# Patient Record
Sex: Male | Born: 1980 | Race: White | Hispanic: No | Marital: Married | Smoking: Never smoker
Health system: Southern US, Community
[De-identification: ages and names within clinical notes are randomized; demographics above are authoritative.]

## PROBLEM LIST (undated history)

## (undated) DIAGNOSIS — N200 Calculus of kidney: Secondary | ICD-10-CM

## (undated) DIAGNOSIS — E669 Obesity, unspecified: Secondary | ICD-10-CM

## (undated) DIAGNOSIS — Z87442 Personal history of urinary calculi: Secondary | ICD-10-CM

## (undated) DIAGNOSIS — I499 Cardiac arrhythmia, unspecified: Secondary | ICD-10-CM

## (undated) DIAGNOSIS — F419 Anxiety disorder, unspecified: Secondary | ICD-10-CM

## (undated) DIAGNOSIS — G4730 Sleep apnea, unspecified: Secondary | ICD-10-CM

## (undated) DIAGNOSIS — J309 Allergic rhinitis, unspecified: Secondary | ICD-10-CM

## (undated) DIAGNOSIS — E119 Type 2 diabetes mellitus without complications: Secondary | ICD-10-CM

## (undated) HISTORY — DX: Allergic rhinitis, unspecified: J30.9

## (undated) HISTORY — DX: Cardiac arrhythmia, unspecified: I49.9

## (undated) HISTORY — PX: VASCULAR SURGERY: SHX849

## (undated) HISTORY — PX: CARDIAC ELECTROPHYSIOLOGY STUDY AND ABLATION: SHX1294

## (undated) HISTORY — DX: Anxiety disorder, unspecified: F41.9

## (undated) HISTORY — DX: Obesity, unspecified: E66.9

---
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
## 2000-11-23 ENCOUNTER — Ambulatory Visit (HOSPITAL_COMMUNITY): Admission: RE | Admit: 2000-11-23 | Discharge: 2000-11-23 | Payer: Self-pay | Admitting: Internal Medicine

## 2000-11-23 ENCOUNTER — Encounter: Payer: Self-pay | Admitting: Internal Medicine

## 2001-08-06 ENCOUNTER — Emergency Department (HOSPITAL_COMMUNITY): Admission: EM | Admit: 2001-08-06 | Discharge: 2001-08-06 | Payer: Self-pay | Admitting: Emergency Medicine

## 2002-11-10 ENCOUNTER — Encounter: Payer: Self-pay | Admitting: Family Medicine

## 2002-11-10 ENCOUNTER — Ambulatory Visit (HOSPITAL_COMMUNITY): Admission: RE | Admit: 2002-11-10 | Discharge: 2002-11-10 | Payer: Self-pay | Admitting: Family Medicine

## 2003-07-04 ENCOUNTER — Emergency Department (HOSPITAL_COMMUNITY): Admission: EM | Admit: 2003-07-04 | Discharge: 2003-07-04 | Payer: Self-pay | Admitting: Emergency Medicine

## 2010-10-24 ENCOUNTER — Ambulatory Visit (INDEPENDENT_AMBULATORY_CARE_PROVIDER_SITE_OTHER): Payer: Self-pay | Admitting: Internal Medicine

## 2011-05-30 ENCOUNTER — Other Ambulatory Visit: Payer: Self-pay | Admitting: Family Medicine

## 2011-05-30 ENCOUNTER — Ambulatory Visit (HOSPITAL_COMMUNITY)
Admission: RE | Admit: 2011-05-30 | Discharge: 2011-05-30 | Disposition: A | Discharge status: Home / Self Care | Payer: Managed Care, Other (non HMO) | Source: Ambulatory Visit | Attending: Family Medicine | Admitting: Family Medicine

## 2011-05-30 DIAGNOSIS — R059 Cough, unspecified: Secondary | ICD-10-CM | POA: Insufficient documentation

## 2011-05-30 DIAGNOSIS — R05 Cough: Secondary | ICD-10-CM | POA: Insufficient documentation

## 2011-05-30 DIAGNOSIS — R0609 Other forms of dyspnea: Secondary | ICD-10-CM | POA: Insufficient documentation

## 2011-05-30 DIAGNOSIS — R062 Wheezing: Secondary | ICD-10-CM | POA: Insufficient documentation

## 2011-05-30 DIAGNOSIS — R0989 Other specified symptoms and signs involving the circulatory and respiratory systems: Secondary | ICD-10-CM | POA: Insufficient documentation

## 2011-05-30 MED ORDER — ALBUTEROL SULFATE (5 MG/ML) 0.5% IN NEBU
2.5000 mg | INHALATION_SOLUTION | Freq: Once | RESPIRATORY_TRACT | Status: AC
  Administered 2011-05-30: 2.5 mg via RESPIRATORY_TRACT

## 2011-05-31 NOTE — Procedures (Signed)
NAMETEQUAN, REDMON             ACCOUNT NO.:  [id number]  MEDICAL RECORD NO.:  [id number]  LOCATION:  RESP                          FACILITY:  [facility]  PHYSICIAN:  Paulla Mcclaskey L. Juanetta Gosling, M.D.DATE OF BIRTH:  1981/06/16  DATE OF PROCEDURE: DATE OF DISCHARGE:  05/30/2011                           PULMONARY FUNCTION TEST   REASON FOR PULMONARY FUNCTION TESTING:  Chronic cough.  1. Spirometry does not show a ventilatory defect, but does show     evidence of airflow obstruction.  This could be the cause of her     chronic cough. 2. There is no significant bronchodilator improvement.     Asia Favata L. Juanetta Gosling, M.D.     [facility]/MEDQ  D:  05/31/2011  T:  05/31/2011  Job:  [id number]  cc:   Donna Bernard, M.D. Fax: [phone]

## 2011-08-29 ENCOUNTER — Encounter (INDEPENDENT_AMBULATORY_CARE_PROVIDER_SITE_OTHER): Payer: Self-pay

## 2011-08-30 ENCOUNTER — Other Ambulatory Visit: Payer: Self-pay | Admitting: Family Medicine

## 2011-08-30 DIAGNOSIS — R748 Abnormal levels of other serum enzymes: Secondary | ICD-10-CM

## 2011-09-05 ENCOUNTER — Ambulatory Visit (HOSPITAL_COMMUNITY)
Admission: RE | Admit: 2011-09-05 | Discharge: 2011-09-05 | Disposition: A | Discharge status: Home / Self Care | Payer: Managed Care, Other (non HMO) | Source: Ambulatory Visit | Attending: Family Medicine | Admitting: Family Medicine

## 2011-09-05 ENCOUNTER — Ambulatory Visit (INDEPENDENT_AMBULATORY_CARE_PROVIDER_SITE_OTHER): Payer: Self-pay | Admitting: General Surgery

## 2011-09-05 ENCOUNTER — Other Ambulatory Visit: Payer: Self-pay | Admitting: Family Medicine

## 2011-09-05 DIAGNOSIS — R748 Abnormal levels of other serum enzymes: Secondary | ICD-10-CM

## 2011-09-05 DIAGNOSIS — R160 Hepatomegaly, not elsewhere classified: Secondary | ICD-10-CM | POA: Insufficient documentation

## 2011-09-05 DIAGNOSIS — K7689 Other specified diseases of liver: Secondary | ICD-10-CM | POA: Insufficient documentation

## 2011-09-14 ENCOUNTER — Encounter (INDEPENDENT_AMBULATORY_CARE_PROVIDER_SITE_OTHER): Payer: Self-pay | Admitting: General Surgery

## 2011-09-15 ENCOUNTER — Ambulatory Visit (INDEPENDENT_AMBULATORY_CARE_PROVIDER_SITE_OTHER): Payer: Managed Care, Other (non HMO) | Admitting: Surgery

## 2011-10-25 ENCOUNTER — Ambulatory Visit (INDEPENDENT_AMBULATORY_CARE_PROVIDER_SITE_OTHER): Payer: Managed Care, Other (non HMO) | Admitting: Surgery

## 2012-04-25 ENCOUNTER — Ambulatory Visit (INDEPENDENT_AMBULATORY_CARE_PROVIDER_SITE_OTHER): Payer: Managed Care, Other (non HMO) | Admitting: Otolaryngology

## 2012-05-02 ENCOUNTER — Ambulatory Visit (INDEPENDENT_AMBULATORY_CARE_PROVIDER_SITE_OTHER): Payer: Managed Care, Other (non HMO) | Admitting: Otolaryngology

## 2012-05-02 DIAGNOSIS — K219 Gastro-esophageal reflux disease without esophagitis: Secondary | ICD-10-CM

## 2012-05-02 DIAGNOSIS — J342 Deviated nasal septum: Secondary | ICD-10-CM

## 2012-05-02 DIAGNOSIS — J310 Chronic rhinitis: Secondary | ICD-10-CM

## 2012-05-02 DIAGNOSIS — J351 Hypertrophy of tonsils: Secondary | ICD-10-CM

## 2012-05-02 DIAGNOSIS — R070 Pain in throat: Secondary | ICD-10-CM

## 2012-11-22 ENCOUNTER — Encounter: Payer: Self-pay | Admitting: Family Medicine

## 2012-11-22 ENCOUNTER — Ambulatory Visit (INDEPENDENT_AMBULATORY_CARE_PROVIDER_SITE_OTHER): Payer: Managed Care, Other (non HMO) | Admitting: Family Medicine

## 2012-11-22 VITALS — BP 130/106 | Temp 98.5°F | Ht 75.5 in | Wt 330.0 lb

## 2012-11-22 DIAGNOSIS — G4700 Insomnia, unspecified: Secondary | ICD-10-CM

## 2012-11-22 DIAGNOSIS — R350 Frequency of micturition: Secondary | ICD-10-CM

## 2012-11-22 DIAGNOSIS — N200 Calculus of kidney: Secondary | ICD-10-CM

## 2012-11-22 DIAGNOSIS — J45909 Unspecified asthma, uncomplicated: Secondary | ICD-10-CM | POA: Insufficient documentation

## 2012-11-22 LAB — POCT URINALYSIS DIPSTICK: pH, UA: 6

## 2012-11-22 NOTE — Progress Notes (Signed)
  Subjective:    Patient ID: Aaron Chambers, male    DOB: 09-Jul-1981, 32 y.o.   MRN: [id number]  Urinary Tract Infection  This is a new problem. The current episode started in the past 7 days. The problem occurs every urination. The problem has been gradually worsening. The quality of the pain is described as stabbing. The pain is at a severity of 6/10. The pain is moderate. There has been no fever. The fever has been present for less than 1 day. He is sexually active. There is no history of pyelonephritis. Associated symptoms include flank pain (right flank) and nausea. Pertinent negatives include no chills. He has tried nothing for the symptoms. His past medical history is significant for kidney stones.   Patient notes ongoing chronic anxiety. States the Klonopin helps him. Also helps him when he tries to get sleep at night.  Patient reports wheezing has improved considerably. He is walking and exercising some. No headache or chest pain decent appetite.   Review of Systems  Constitutional: Negative for chills.  Gastrointestinal: Positive for nausea.  Genitourinary: Positive for flank pain (right flank).   ROS otherwise negative.    Objective:   Physical Exam  Alert no acute distress. Obesity present. Vitals reviewed. Blood pressure improved on repeat 132/84. Lungs clear. Heart regular rate and rhythm. Plus minus right flank tenderness to percussion. Some right lower quadrant tenderness evident. Abdomen no rebound no guarding.     urinalysis 6-8 red blood cells per high-power field. No white blood cells no epis. Assessment & Plan:  Impression 1 probable kidney stone discussed at length. #2 chronic anxiety with insomnia stable. Ongoing need for meds. Discussed. #3 asthma clinically stable at this time. Plan hydrocodone 5/325 #24 one every 6 when necessary. Zofran ODT when necessary. Rationale discussed. Warning signs discussed. Also use ibuprofen 600 6 hours when necessary. Encourage  liquids. 25 minutes spent most in discussion. WSLQ.

## 2012-11-22 NOTE — Patient Instructions (Addendum)
Please increase fluid intake next several days. Also take three to four otc ibuprofens three times per day next several days

## 2012-12-18 ENCOUNTER — Encounter: Payer: Self-pay | Admitting: *Deleted

## 2012-12-23 ENCOUNTER — Encounter: Payer: Self-pay | Admitting: Family Medicine

## 2012-12-23 ENCOUNTER — Ambulatory Visit (INDEPENDENT_AMBULATORY_CARE_PROVIDER_SITE_OTHER): Payer: Managed Care, Other (non HMO) | Admitting: Family Medicine

## 2012-12-23 VITALS — BP 138/80 | Wt 349.2 lb

## 2012-12-23 DIAGNOSIS — Z79899 Other long term (current) drug therapy: Secondary | ICD-10-CM

## 2012-12-23 DIAGNOSIS — E782 Mixed hyperlipidemia: Secondary | ICD-10-CM

## 2012-12-23 DIAGNOSIS — Z0000 Encounter for general adult medical examination without abnormal findings: Secondary | ICD-10-CM

## 2012-12-23 MED ORDER — CHLORZOXAZONE 500 MG PO TABS: 500.0000 mg | ORAL_TABLET | Freq: Three times a day (TID) | ORAL | Status: DC | PRN

## 2012-12-23 MED ORDER — ETODOLAC 400 MG PO TABS: 400.0000 mg | ORAL_TABLET | Freq: Two times a day (BID) | ORAL | Status: DC

## 2012-12-23 NOTE — Progress Notes (Signed)
  Subjective:    Patient ID: Aaron Chambers, male    DOB: 1980-09-03, 32 y.o.   MRN: [id number]  Abdominal Pain This is a new problem. The current episode started 1 to 4 weeks ago. The onset quality is gradual. The problem occurs 2 to 4 times per day. The problem has been waxing and waning. The pain is located in the epigastric region. The pain is at a severity of 6/10. The pain is moderate. The quality of the pain is dull and aching. The abdominal pain radiates to the back. Associated symptoms include belching. Pertinent negatives include no anorexia, fever, frequency, headaches or vomiting. Nothing aggravates the pain. The pain is relieved by certain positions. He has tried antacids for the symptoms. The treatment provided no relief. There is no history of abdominal surgery. recent challenge with kidney stones   Patient watching his diet fairly close. Trying to get some exercising.  Reports overall his wheeziness stable.  No further symptoms of kidney stones thankfully.   Review of Systems  Constitutional: Negative for fever, activity change and appetite change.  HENT: Negative for congestion, rhinorrhea and neck pain.   Eyes: Negative for discharge.  Respiratory: Negative for cough and wheezing.   Cardiovascular: Negative for chest pain.  Gastrointestinal: Positive for abdominal pain. Negative for vomiting, blood in stool and anorexia.  Genitourinary: Negative for frequency and difficulty urinating.  Skin: Negative for rash.  Allergic/Immunologic: Negative for environmental allergies and food allergies.  Neurological: Negative for weakness and headaches.  Psychiatric/Behavioral: Negative for agitation.       Objective:   Physical Exam  Vitals reviewed. Constitutional: He appears well-developed and well-nourished.  Morbid obesity noted on exam with BMI of 44  HENT:  Head: Normocephalic and atraumatic.  Right Ear: External ear normal.  Left Ear: External ear normal.  Nose:  Nose normal.  Mouth/Throat: Oropharynx is clear and moist.  Eyes: EOM are normal. Pupils are equal, round, and reactive to light.  Neck: Normal range of motion. Neck supple. No thyromegaly present.  Cardiovascular: Normal rate, regular rhythm and normal heart sounds.   No murmur heard. Pulmonary/Chest: Effort normal and breath sounds normal. No respiratory distress. He has no wheezes.  Abdominal: Soft. Bowel sounds are normal. He exhibits no distension and no mass. There is no tenderness.  Genitourinary: Penis normal.  Musculoskeletal: Normal range of motion. He exhibits no edema.  Right posterior mid back tender to palpation. No obvious deformity  Lymphadenopathy:    He has no cervical adenopathy.  Neurological: He is alert. He exhibits normal muscle tone.  Skin: Skin is warm and dry. No erythema.  Psychiatric: He has a normal mood and affect. His behavior is normal. Judgment normal.          Assessment & Plan:  Impression 1 morbid obesity. #2 subacute thoracic strain discussed. Highly doubt gallstones or kidney stones etc. plan diet exercise discussed and strongly encourage. Appropriate blood work. Lodine twice a day when necessary for back pain. Chlorzoxazone 500 3 times a day when necessary for spasm. WSL

## 2012-12-26 LAB — LIPID PANEL
Total CHOL/HDL Ratio: 4.2 Ratio
VLDL: 23 mg/dL (ref 0–40)

## 2012-12-26 LAB — HEPATIC FUNCTION PANEL
AST: 29 U/L (ref 0–37)
Bilirubin, Direct: 0.1 mg/dL (ref 0.0–0.3)
Total Bilirubin: 0.7 mg/dL (ref 0.3–1.2)

## 2012-12-26 LAB — GLUCOSE, RANDOM: Glucose, Bld: 85 mg/dL (ref 70–99)

## 2013-01-06 ENCOUNTER — Encounter: Payer: Self-pay | Admitting: Family Medicine

## 2013-01-08 ENCOUNTER — Telehealth: Payer: Self-pay | Admitting: Family Medicine

## 2013-01-08 NOTE — Telephone Encounter (Signed)
Enc date 01/06/13 - letter printed & mailed 01/09/13

## 2013-05-09 ENCOUNTER — Other Ambulatory Visit: Payer: Self-pay | Admitting: Nurse Practitioner

## 2013-05-09 MED ORDER — CLONAZEPAM 0.5 MG PO TABS: 0.5000 mg | ORAL_TABLET | Freq: Two times a day (BID) | ORAL | Status: DC | PRN

## 2013-06-09 ENCOUNTER — Encounter: Payer: Self-pay | Admitting: Family Medicine

## 2013-06-09 ENCOUNTER — Ambulatory Visit (INDEPENDENT_AMBULATORY_CARE_PROVIDER_SITE_OTHER): Payer: Managed Care, Other (non HMO) | Admitting: Family Medicine

## 2013-06-09 VITALS — BP 138/98 | Temp 98.8°F | Ht 76.0 in | Wt 326.0 lb

## 2013-06-09 DIAGNOSIS — J329 Chronic sinusitis, unspecified: Secondary | ICD-10-CM

## 2013-06-09 MED ORDER — AMOXICILLIN-POT CLAVULANATE 875-125 MG PO TABS: 1.0000 | ORAL_TABLET | Freq: Two times a day (BID) | ORAL | Status: AC

## 2013-06-09 NOTE — Progress Notes (Signed)
   Subjective:    Patient ID: Aaron Chambers, male    DOB: 09/07/1980, 32 y.o.   MRN: [id number]  Sinusitis This is a new problem. The current episode started in the past 7 days. There has been no fever. Associated symptoms include congestion, coughing, headaches and sinus pressure. Past treatments include oral decongestants. The treatment provided mild relief.   Stirred up the wheezing some,  Pos nasl congestion  Gunkiness, energy level diminished,  Sinus tabs prn. Helped a bit.   Review of Systems  HENT: Positive for congestion and sinus pressure.   Respiratory: Positive for cough.   Neurological: Positive for headaches.   no vomiting no diarrhea no rash ROS otherwise negative     Objective:   Physical Exam Alert, mild malaise blood pressure repeat 134/82. Moderate nasal congestion. Frontal and maxillary tenderness. TMs good pharynx normal neck supple. Lungs clear heart regular in rhythm.       Assessment & Plan:  Impression acute rhinosinusitis plan Augmentin twice a day 10 days. Symptomatic care discussed. WSL

## 2013-10-03 ENCOUNTER — Telehealth: Payer: Self-pay | Admitting: Family Medicine

## 2013-10-03 ENCOUNTER — Encounter: Payer: Self-pay | Admitting: Family Medicine

## 2013-10-03 ENCOUNTER — Ambulatory Visit (INDEPENDENT_AMBULATORY_CARE_PROVIDER_SITE_OTHER): Payer: Managed Care, Other (non HMO) | Admitting: Family Medicine

## 2013-10-03 VITALS — BP 150/100 | Ht 76.0 in | Wt 331.0 lb

## 2013-10-03 DIAGNOSIS — N200 Calculus of kidney: Secondary | ICD-10-CM

## 2013-10-03 DIAGNOSIS — R0683 Snoring: Secondary | ICD-10-CM

## 2013-10-03 DIAGNOSIS — G4700 Insomnia, unspecified: Secondary | ICD-10-CM

## 2013-10-03 DIAGNOSIS — J45909 Unspecified asthma, uncomplicated: Secondary | ICD-10-CM

## 2013-10-03 DIAGNOSIS — R0609 Other forms of dyspnea: Secondary | ICD-10-CM

## 2013-10-03 DIAGNOSIS — R0989 Other specified symptoms and signs involving the circulatory and respiratory systems: Secondary | ICD-10-CM

## 2013-10-03 DIAGNOSIS — G4733 Obstructive sleep apnea (adult) (pediatric): Secondary | ICD-10-CM

## 2013-10-03 MED ORDER — ALBUTEROL SULFATE (2.5 MG/3ML) 0.083% IN NEBU: 2.5000 mg | INHALATION_SOLUTION | Freq: Four times a day (QID) | RESPIRATORY_TRACT | Status: DC | PRN

## 2013-10-03 MED ORDER — CLONAZEPAM 0.5 MG PO TABS: 0.5000 mg | ORAL_TABLET | Freq: Two times a day (BID) | ORAL | Status: DC | PRN

## 2013-10-03 NOTE — Telephone Encounter (Signed)
Error was made. °

## 2013-10-03 NOTE — Telephone Encounter (Signed)
Error was made.

## 2013-10-03 NOTE — Progress Notes (Signed)
   Subjective:    Patient ID: Aaron Chambers, male    DOB: May 19, 1981, 33 y.o.   MRN: [id number]  HPI Patient is here today for a refill on his Klonopin.   Patient has been exercising daily and eating healthy, but he is still having trouble losing weight. He would like to discuss weight loss measures.hx of   Also, he had the first part of a sleep study done a few years ago, but never did the second half. He wanders if he has sleep apnea. Years ago had progressive trouble sleeping.   Exercising regularly now, frustrated not losing weight.  snorez a lot, daytime tiredness has improved with exercise, but still more tired than he would hope    occas wheezing when around dust. Overall the asthma has been fairly stable.  Allergies are stable as long as she sticks with medication. Some nasal discharge. Some sneezing this. Worse this time of year.  Review of Systems No headache no chest pain no back pain no abdominal pain no change in bowel habits no blood in stool ROS otherwise negative    Objective:   Physical Exam  Blood pressure initially elevated 134/85 on repeat. Lungs clear. Heart regular in rhythm. H&T normal. Ankles without edema.      Assessment & Plan:  Impression 1 asthma clinically stable. #2 insomnia ongoing discussed. #3 anxiety ongoing discussed. #4 sleep apnea probable diagnosis. #5 morbid obesity Accompanied by daytime fatigue. An intermittent bleeding stopping through the night per her spouse. Plan sleep study rationale discussed. Refill chronic meds. Diet exercise discussed. Encouraged to lose weight

## 2013-10-03 NOTE — Patient Instructions (Signed)
We'll authorize and schedule the sleep study

## 2013-10-04 DIAGNOSIS — G4733 Obstructive sleep apnea (adult) (pediatric): Secondary | ICD-10-CM | POA: Insufficient documentation

## 2013-11-26 ENCOUNTER — Ambulatory Visit: Payer: Managed Care, Other (non HMO) | Attending: Family Medicine | Admitting: Sleep Medicine

## 2013-11-26 DIAGNOSIS — Z79899 Other long term (current) drug therapy: Secondary | ICD-10-CM | POA: Insufficient documentation

## 2013-11-26 DIAGNOSIS — G4733 Obstructive sleep apnea (adult) (pediatric): Secondary | ICD-10-CM | POA: Insufficient documentation

## 2013-12-02 NOTE — Sleep Study (Signed)
  [facility] Elyzabeth Goatley A. Gerilyn Pilgrim, MD     [email or web address]        HOME SLEEP STUDY   LOCATION: SLEEP LAB FACILITY: [facility]   PHYSICIAN: Suly Vukelich A. Gerilyn Pilgrim, M.D.   DATE OF STUDY: 11/26/2013.   REFERRING PHYSICIAN: Ardyth Gal.  INDICATIONS: The patient is a 33 year old presents with snoring and insomnia.  MEDICATIONS:  Prior to Admission medications   Medication Sig Start Date End Date Taking? Authorizing Provider  albuterol (PROVENTIL) (2.5 MG/3ML) 0.083% nebulizer solution Take 3 mLs (2.5 mg total) by nebulization every 6 (six) hours as needed. 10/03/13   Merlyn Albert, MD  clonazePAM (KLONOPIN) 0.5 MG tablet Take 1 tablet (0.5 mg total) by mouth 2 (two) times daily as needed. 10/03/13   Merlyn Albert, MD  fluticasone (FLONASE) 50 MCG/ACT nasal spray Place 2 sprays into the nose daily.    Historical Provider, MD      EPWORTH SLEEPINESS SCALE: Not reported.   BMI: 40.   ARCHITECTURAL SUMMARY: Total recording time was 568 minutes.   RESPIRATORY DATA:  The lowest saturation is 78 %. The diagnostic AHI is 17.   ELECTROCARDIOGRAM SUMMARY: Average heart rate is 63 with no significant dysrhythmias observed.   IMPRESSION:  1. Moderate obstructive sleep apnea syndrome. Formal CPAP titration recording is recommended.  Thanks for this referral.  Gilverto Dileonardo A. Gerilyn Pilgrim, M.D. Diplomat, Biomedical engineer of Sleep Medicine.

## 2013-12-19 ENCOUNTER — Telehealth: Payer: Self-pay | Admitting: Family Medicine

## 2013-12-19 NOTE — Telephone Encounter (Signed)
Pt has not heard anything from his home sleep study, results are in chart dated 11/26/2013, please review and advise next step, please call pt when done

## 2013-12-22 NOTE — Telephone Encounter (Signed)
Discussed with patient- Office visit scheduled. 

## 2013-12-22 NOTE — Telephone Encounter (Signed)
Contact pt, study reveals sleep apnea, needs ov to discuss therapy (CPAP)along with setting it up

## 2014-01-07 ENCOUNTER — Encounter: Payer: Self-pay | Admitting: Family Medicine

## 2014-01-07 ENCOUNTER — Ambulatory Visit (INDEPENDENT_AMBULATORY_CARE_PROVIDER_SITE_OTHER): Payer: Managed Care, Other (non HMO) | Admitting: Family Medicine

## 2014-01-07 VITALS — BP 148/88 | Ht 76.0 in | Wt 338.0 lb

## 2014-01-07 DIAGNOSIS — G4733 Obstructive sleep apnea (adult) (pediatric): Secondary | ICD-10-CM

## 2014-01-07 NOTE — Progress Notes (Signed)
   Subjective:    Patient ID: Aaron Chambers, male    DOB: 1980-12-26, 33 y.o.   MRN: [id number]  HPI Patient arrives to follow up to discuss results of recent sleep study.  Patient recently had sleep study. He confirms obstructive sleep apnea. See results. Discussed at length with patient.  Patient noted that the trouble sleeping, daytime drowsiness, trouble breathing at night and deep snoring is an ongoing problem. Review of Systems No chest pain no headache no back pain    Objective:   Physical Exam Alert no apparent distress. Blood pressure good on repeat lungs clear heart rare in rhythm.       Assessment & Plan:  Impression obstructive sleep apnea discussed plan CPAP auto titration machine prescribed along with mask and tubing. Sleep study confirms AHI of 17 discussed with patient. Most time spent in discussion easily 15 minutes Patient to followup in several months.

## 2014-02-18 ENCOUNTER — Encounter: Payer: Managed Care, Other (non HMO) | Admitting: Family Medicine

## 2014-03-23 ENCOUNTER — Ambulatory Visit (INDEPENDENT_AMBULATORY_CARE_PROVIDER_SITE_OTHER): Payer: Managed Care, Other (non HMO) | Admitting: Family Medicine

## 2014-03-23 ENCOUNTER — Encounter: Payer: Self-pay | Admitting: Family Medicine

## 2014-03-23 VITALS — BP 116/80 | Ht 75.0 in | Wt 382.0 lb

## 2014-03-23 DIAGNOSIS — R5381 Other malaise: Secondary | ICD-10-CM

## 2014-03-23 DIAGNOSIS — Z23 Encounter for immunization: Secondary | ICD-10-CM

## 2014-03-23 DIAGNOSIS — Z79899 Other long term (current) drug therapy: Secondary | ICD-10-CM

## 2014-03-23 DIAGNOSIS — G4733 Obstructive sleep apnea (adult) (pediatric): Secondary | ICD-10-CM

## 2014-03-23 DIAGNOSIS — R5383 Other fatigue: Secondary | ICD-10-CM

## 2014-03-23 DIAGNOSIS — Z0000 Encounter for general adult medical examination without abnormal findings: Secondary | ICD-10-CM

## 2014-03-23 NOTE — Progress Notes (Signed)
   Subjective:    Patient ID: Aaron Chambers, male    DOB: 09/07/1980, 33 y.o.   MRN: [id number]  HPI The patient comes in today for a wellness visit.    A review of their health history was completed.  A review of medications was also completed.  Any needed refills; none  Eating habits: good  Falls/  MVA accidents in past few months: none  Regular exercise: good  Specialist pt sees on regular basis: vein specialist   Preventative health issues were discussed.   Additional concerns: tingling, twitching in the left arm  Trying to eat better and now exefcising regularly  Using the cpap. Uses full facial mask for dpap.  Major joints fine with walking, uncomfortable with jogging etc.   Review of Systems  Constitutional: Negative for fever, activity change and appetite change.       Ongoing weight gain despite best efforts does report CPAP has helped energy level and sleep  HENT: Negative for congestion and rhinorrhea.   Eyes: Negative for discharge.  Respiratory: Negative for cough and wheezing.   Cardiovascular: Negative for chest pain.  Gastrointestinal: Negative for vomiting, abdominal pain and blood in stool.  Genitourinary: Negative for frequency and difficulty urinating.  Musculoskeletal: Negative for neck pain.  Skin: Negative for rash.  Allergic/Immunologic: Negative for environmental allergies and food allergies.  Neurological: Negative for weakness and headaches.  Psychiatric/Behavioral: Negative for agitation.  All other systems reviewed and are negative.      Objective:   Physical Exam  Vitals reviewed. Constitutional: He appears well-developed and well-nourished.  Considerable obesity present  HENT:  Head: Normocephalic and atraumatic.  Right Ear: External ear normal.  Left Ear: External ear normal.  Nose: Nose normal.  Mouth/Throat: Oropharynx is clear and moist.  Eyes: EOM are normal. Pupils are equal, round, and reactive to light.  Neck:  Normal range of motion. Neck supple. No thyromegaly present.  Cardiovascular: Normal rate, regular rhythm and normal heart sounds.   No murmur heard. Pulmonary/Chest: Effort normal and breath sounds normal. No respiratory distress. He has no wheezes.  Abdominal: Soft. Bowel sounds are normal. He exhibits no distension and no mass. There is no tenderness.  Genitourinary: Penis normal.  Musculoskeletal: Normal range of motion. He exhibits no edema.  Lymphadenopathy:    He has no cervical adenopathy.  Neurological: He is alert. He exhibits normal muscle tone.  Skin: Skin is warm and dry. No erythema.  Psychiatric: He has a normal mood and affect. His behavior is normal. Judgment normal.          Assessment & Plan:  Impression 1 wellness exam #2 sleep apnea much improved #3 morbid obesity BMI 47 discussed at length. Plan patient would like 3 more months which I to lose at least 50 pounds. States if he does not do that willing to proceed with bariatric referral. Maintain other meds and approaches. Diet exercise discussed. Appropriate blood work. WSL

## 2014-03-24 LAB — LIPID PANEL
Cholesterol: 153 mg/dL (ref 0–200)
HDL: 35 mg/dL — ABNORMAL LOW (ref >39)
LDL CALC: 93 mg/dL (ref 0–99)
TRIGLYCERIDES: 124 mg/dL (ref <150)
Total CHOL/HDL Ratio: 4.4 Ratio
VLDL: 25 mg/dL (ref 0–40)

## 2014-03-24 LAB — HEPATIC FUNCTION PANEL
ALK PHOS: 81 U/L (ref 39–117)
ALT: 81 U/L — ABNORMAL HIGH (ref 0–53)
AST: 39 U/L — ABNORMAL HIGH (ref 0–37)
Albumin: 4.2 g/dL (ref 3.5–5.2)
BILIRUBIN DIRECT: 0.2 mg/dL (ref 0.0–0.3)
BILIRUBIN INDIRECT: 0.6 mg/dL (ref 0.2–1.2)
BILIRUBIN TOTAL: 0.8 mg/dL (ref 0.2–1.2)
Total Protein: 6.6 g/dL (ref 6.0–8.3)

## 2014-03-24 LAB — BASIC METABOLIC PANEL
BUN: 16 mg/dL (ref 6–23)
CHLORIDE: 104 meq/L (ref 96–112)
CO2: 26 meq/L (ref 19–32)
CREATININE: 0.9 mg/dL (ref 0.50–1.35)
Calcium: 9.3 mg/dL (ref 8.4–10.5)
GLUCOSE: 88 mg/dL (ref 70–99)
POTASSIUM: 4.5 meq/L (ref 3.5–5.3)
Sodium: 140 mEq/L (ref 135–145)

## 2014-03-24 LAB — TSH: TSH: 1.56 u[IU]/mL (ref 0.350–4.500)

## 2014-03-29 ENCOUNTER — Encounter: Payer: Self-pay | Admitting: Family Medicine

## 2014-05-01 ENCOUNTER — Other Ambulatory Visit: Payer: Self-pay | Admitting: Family Medicine

## 2014-05-01 NOTE — Telephone Encounter (Signed)
Ok plus 5 monthly ref 

## 2014-05-01 NOTE — Telephone Encounter (Signed)
Last seen 03/23/14.

## 2014-05-04 ENCOUNTER — Other Ambulatory Visit: Payer: Self-pay | Admitting: Family Medicine

## 2014-05-05 ENCOUNTER — Telehealth: Payer: Self-pay | Admitting: Family Medicine

## 2014-05-05 NOTE — Telephone Encounter (Signed)
Already done thru rx request nurses call pt

## 2014-05-05 NOTE — Telephone Encounter (Signed)
Patient requesting Rx for clonazePAM (KLONOPIN) 0.5 MG tablet [facility].  They are telling him that they have not heard from our office.

## 2014-05-05 NOTE — Telephone Encounter (Signed)
Ok plus 5 monthly ref 

## 2014-05-05 NOTE — Telephone Encounter (Signed)
Patient notified

## 2014-06-19 ENCOUNTER — Ambulatory Visit: Payer: Managed Care, Other (non HMO) | Admitting: Family Medicine

## 2014-08-27 ENCOUNTER — Emergency Department (HOSPITAL_COMMUNITY): Payer: Managed Care, Other (non HMO)

## 2014-08-27 ENCOUNTER — Encounter (HOSPITAL_COMMUNITY): Payer: Self-pay

## 2014-08-27 ENCOUNTER — Emergency Department (HOSPITAL_COMMUNITY)
Admission: EM | Admit: 2014-08-27 | Discharge: 2014-08-27 | Disposition: A | Discharge status: Home / Self Care | Payer: Managed Care, Other (non HMO) | Attending: Emergency Medicine | Admitting: Emergency Medicine

## 2014-08-27 ENCOUNTER — Telehealth: Payer: Self-pay

## 2014-08-27 DIAGNOSIS — R079 Chest pain, unspecified: Secondary | ICD-10-CM | POA: Diagnosis present

## 2014-08-27 DIAGNOSIS — J45909 Unspecified asthma, uncomplicated: Secondary | ICD-10-CM | POA: Diagnosis not present

## 2014-08-27 DIAGNOSIS — F419 Anxiety disorder, unspecified: Secondary | ICD-10-CM | POA: Diagnosis not present

## 2014-08-27 DIAGNOSIS — E669 Obesity, unspecified: Secondary | ICD-10-CM | POA: Insufficient documentation

## 2014-08-27 DIAGNOSIS — Z79899 Other long term (current) drug therapy: Secondary | ICD-10-CM | POA: Insufficient documentation

## 2014-08-27 DIAGNOSIS — R110 Nausea: Secondary | ICD-10-CM | POA: Insufficient documentation

## 2014-08-27 LAB — BASIC METABOLIC PANEL
Anion gap: 8 (ref 5–15)
BUN: 15 mg/dL (ref 6–23)
CALCIUM: 9.4 mg/dL (ref 8.4–10.5)
CO2: 24 mmol/L (ref 19–32)
Chloride: 107 mmol/L (ref 96–112)
Creatinine, Ser: 0.95 mg/dL (ref 0.50–1.35)
GFR calc Af Amer: >90 mL/min (ref >90)
GFR calc non Af Amer: >90 mL/min (ref >90)
GLUCOSE: 120 mg/dL — AB (ref 70–99)
Potassium: 3.9 mmol/L (ref 3.5–5.1)
SODIUM: 139 mmol/L (ref 135–145)

## 2014-08-27 LAB — I-STAT TROPONIN, ED
TROPONIN I, POC: 0 ng/mL (ref 0.00–0.08)
Troponin i, poc: 0 ng/mL (ref 0.00–0.08)

## 2014-08-27 LAB — CBC
HCT: 44.6 % (ref 39.0–52.0)
HEMOGLOBIN: 15 g/dL (ref 13.0–17.0)
MCH: 28.3 pg (ref 26.0–34.0)
MCHC: 33.6 g/dL (ref 30.0–36.0)
MCV: 84.2 fL (ref 78.0–100.0)
Platelets: 218 10*3/uL (ref 150–400)
RBC: 5.3 MIL/uL (ref 4.22–5.81)
RDW: 13.8 % (ref 11.5–15.5)
WBC: 10.2 10*3/uL (ref 4.0–10.5)

## 2014-08-27 NOTE — Telephone Encounter (Signed)
Patient called and stated that he is having nausea, upper abdominal pain, dizziness and left arm pain that has been present for about 2 days now. Consulted Dr. Lorin PicketScott and I advised the patient with the symptoms that he is experiencing it is best that he go to the nearest ER to be evaluated ASAP because this could be heart related. Patient verbalized understanding and agreed and stated [facility].

## 2014-08-27 NOTE — Discharge Instructions (Signed)

## 2014-08-27 NOTE — ED Provider Notes (Signed)
CSN: [id number]     Arrival date & time 08/27/14  1404 History   First MD Initiated Contact with Patient 08/27/14 1635     Chief Complaint  Patient presents with  . Chest Pain     (Consider location/radiation/quality/duration/timing/severity/associated sxs/prior Treatment) Patient is a 34 y.o. male presenting with chest pain. The history is provided by the patient. No language interpreter was used.  Chest Pain Pain location:  L chest Pain quality: aching   Pain radiates to:  L arm Pain radiates to the back: no   Pain severity:  No pain Onset quality:  Gradual Duration:  3 days Timing:  Constant Progression:  Resolved Chronicity:  New Context: not breathing   Relieved by:  Nothing Worsened by:  Nothing tried Ineffective treatments:  None tried Associated symptoms: no heartburn and not vomiting   Pt reports he had pain in his left arm and chest this am. 10:00.  Pt reports pain has resolved.  Pt reports feeling bad earlier in the weak.    Past Medical History  Diagnosis Date  . Asthma   . Anxiety   . Allergic rhinitis   . Obesity   . Arrhythmia    Past Surgical History  Procedure Laterality Date  . Cardiac electrophysiology study and ablation    . Vascular surgery     No family history on file. History  Substance Use Topics  . Smoking status: Never Smoker   . Smokeless tobacco: Not on file  . Alcohol Use: No    Review of Systems  Cardiovascular: Positive for chest pain.  Gastrointestinal: Negative for heartburn and vomiting.  All other systems reviewed and are negative.     Allergies  Review of patient's allergies indicates no known allergies.  Home Medications   Prior to Admission medications   Medication Sig Start Date End Date Taking? Authorizing Provider  albuterol (PROVENTIL) (2.5 MG/3ML) 0.083% nebulizer solution Take 3 mLs (2.5 mg total) by nebulization every 6 (six) hours as needed. 10/03/13   Merlyn AlbertWilliam S Luking, MD  clonazePAM (KLONOPIN) 0.5 MG  tablet TAKE (1) TABLET BY MOUTH TWICE A DAY AS NEEDED. 05/05/14   Merlyn AlbertWilliam S Luking, MD  fluticasone [facility](FLONASE) 50 MCG/ACT nasal spray Place 2 sprays into the nose daily as needed.     Historical Provider, MD   BP 123/64 mmHg  Pulse 78  Temp(Src) 98.7 F (37.1 C) (Oral)  Resp 18  Ht 6\' 4"  (1.93 m)  Wt 384 lb 6 oz (174.351 kg)  BMI 46.81 kg/m2  SpO2 95% Physical Exam  Constitutional: He is oriented to person, place, and time. He appears well-developed and well-nourished.  HENT:  Head: Normocephalic and atraumatic.  Eyes: Conjunctivae and EOM are normal. Pupils are equal, round, and reactive to light.  Neck: Normal range of motion.  Cardiovascular: Normal rate and normal heart sounds.   Pulmonary/Chest: Effort normal and breath sounds normal.  Abdominal: Soft. He exhibits no distension.  Musculoskeletal: Normal range of motion.  Neurological: He is alert and oriented to person, place, and time.  Skin: Skin is warm.  Psychiatric: He has a normal mood and affect.  Nursing note and vitals reviewed.   ED Course  Procedures (including critical care time) Labs Review Labs Reviewed  BASIC METABOLIC PANEL - Abnormal; Notable for the following:    Glucose, Bld 120 (*)    All other components within normal limits  CBC  I-STAT TROPOININ, ED  Rosezena SensorI-STAT TROPOININ, ED    Imaging Review Dg Chest 2  View  08/27/2014   CLINICAL DATA:  Epigastric and chest pain  EXAM: CHEST  2 VIEW  COMPARISON:  May 30, 2011  FINDINGS: Lungs are clear. Heart size and pulmonary vascularity are normal. No adenopathy. No pneumothorax. No bone lesions. There is degenerative change in the thoracic spine.  IMPRESSION: No edema or consolidation.   Electronically Signed   By: Bretta Bang III M.D.   On: 08/27/2014 15:32     EKG Interpretation None    ekg sinus tach 106, nonspecific st changes.   MDM  Troponin negative x 2. Pt monitored  Heart rate in 70's no further tachycardia.  Pt counseled. He has seen  Dr. Eden Emms in the past.  Pt advised to schedule appointment for evaluation.   Pt is currently pain free, Pt advised to return if any problems.    Final diagnoses:  Nausea        Elson Areas, PA-C 08/27/14 2330  Lonia Skinner [place], PA-C 08/28/14 0000  Arby Barrette, MD 08/28/14 [phone]

## 2014-08-27 NOTE — ED Notes (Signed)
Pt reports CP under L breast and throbbing pain radiating down left arm.  Pt also reports some dizziness/weakness.  Pt states "I just don't feel right".

## 2015-03-23 ENCOUNTER — Other Ambulatory Visit: Payer: Self-pay | Admitting: Family Medicine

## 2015-03-23 ENCOUNTER — Telehealth: Payer: Self-pay | Admitting: Family Medicine

## 2015-03-23 MED ORDER — CLONAZEPAM 0.5 MG PO TABS: ORAL_TABLET | ORAL | Status: DC

## 2015-03-23 NOTE — Telephone Encounter (Signed)
May have one refill, he must keep appointment in early October.

## 2015-03-23 NOTE — Telephone Encounter (Signed)
Not sure what request for

## 2015-03-23 NOTE — Telephone Encounter (Signed)
clonazePAM (KLONOPIN) 0.5 MG tablet  Pt has been in the middle of a job change an insurance  Change, unable to make an appt for med follow up He is on the schedule now for 10/7 and would  Like to know if he can get one month on this script?

## 2015-03-23 NOTE — Telephone Encounter (Signed)
Called patient and informed him that prescription was approved per Dr.Scott Luking and to keep appointment in early October. Patient verbalized understanding.

## 2015-04-16 ENCOUNTER — Encounter: Payer: Self-pay | Admitting: Family Medicine

## 2015-04-16 ENCOUNTER — Ambulatory Visit (INDEPENDENT_AMBULATORY_CARE_PROVIDER_SITE_OTHER): Payer: 59 | Admitting: Family Medicine

## 2015-04-16 VITALS — BP 146/90 | Ht 75.5 in | Wt 377.0 lb

## 2015-04-16 DIAGNOSIS — Z13220 Encounter for screening for lipoid disorders: Secondary | ICD-10-CM

## 2015-04-16 DIAGNOSIS — Z0000 Encounter for general adult medical examination without abnormal findings: Secondary | ICD-10-CM

## 2015-04-16 DIAGNOSIS — G4700 Insomnia, unspecified: Secondary | ICD-10-CM | POA: Diagnosis not present

## 2015-04-16 DIAGNOSIS — G4733 Obstructive sleep apnea (adult) (pediatric): Secondary | ICD-10-CM | POA: Diagnosis not present

## 2015-04-16 DIAGNOSIS — Z23 Encounter for immunization: Secondary | ICD-10-CM

## 2015-04-16 MED ORDER — CLONAZEPAM 0.5 MG PO TABS: ORAL_TABLET | ORAL | Status: DC

## 2015-04-16 NOTE — Progress Notes (Signed)
   Subjective:    Patient ID: Aaron Chambers, male    DOB: 1981/03/28, 34 y.o.   MRN: [id number]  HPI The patient comes in today for a wellness visit.  Overall things have gone pretty well, Planning to build a home soon    A review of their health history was completed.  A review of medications was also completed.  Any needed refills: Klonopin  Eating habits: good  Falls/  MVA accidents in past few months: none  Regular exercise: yes, was doing well until recently  Working on diet, now doing better  Eating much smarter now  Specialist pt sees on regular basis: none  Preventative health issues were discussed.   Additional concerns: none  Busy at work, changed jobs    Patient has ongoing challenges with anxiety. Uses the Klonopin on a when necessary basis. States it definitely helps him any definitely needs.  Patient continues to use EPAP device. Helping him sleep much better. Much better rested during the day. Has helped his snoring considerably. Has helped his energy level considerably.  Review of Systems  Constitutional: Negative for fever, activity change and appetite change.       Unfortunately ongoing weight gain  HENT: Negative for congestion and rhinorrhea.   Eyes: Negative for discharge.  Respiratory: Negative for cough and wheezing.   Cardiovascular: Negative for chest pain.  Gastrointestinal: Negative for vomiting, abdominal pain and blood in stool.  Genitourinary: Negative for frequency and difficulty urinating.  Musculoskeletal: Negative for neck pain.  Skin: Negative for rash.  Allergic/Immunologic: Negative for environmental allergies and food allergies.  Neurological: Negative for weakness and headaches.  Psychiatric/Behavioral: Negative for agitation.  All other systems reviewed and are negative.      Objective:   Physical Exam  Constitutional: He appears well-developed and well-nourished.  HENT:  Head: Normocephalic and atraumatic.    Right Ear: External ear normal.  Left Ear: External ear normal.  Nose: Nose normal.  Mouth/Throat: Oropharynx is clear and moist.  Eyes: EOM are normal. Pupils are equal, round, and reactive to light.  Neck: Normal range of motion. Neck supple. No thyromegaly present.  Cardiovascular: Normal rate, regular rhythm and normal heart sounds.   No murmur heard. Pulmonary/Chest: Effort normal and breath sounds normal. No respiratory distress. He has no wheezes.  Abdominal: Soft. Bowel sounds are normal. He exhibits no distension and no mass. There is no tenderness.  Genitourinary: Penis normal.  Musculoskeletal: Normal range of motion. He exhibits no edema.  Lymphadenopathy:    He has no cervical adenopathy.  Neurological: He is alert. He exhibits normal muscle tone.  Skin: Skin is warm and dry. No erythema.  Psychiatric: He has a normal mood and affect. His behavior is normal. Judgment normal.  Vitals reviewed.         Assessment & Plan:  Impression 1 wellness exam #2 morbid obesity discussed at length PMI still 47. I have advised bariatric consult. Patient still wishes to hold this off. Discussed. #3 chronic anxiety with need for meds #4 sleep apnea. Stable on current device plan plan maintain sleep apnea device. Klonopin refill. Diet exercise discussed. Appropriate blood work. Recheck in 6 months. WSL

## 2015-04-30 LAB — HEPATIC FUNCTION PANEL
ALK PHOS: 93 IU/L (ref 39–117)
ALT: 79 IU/L — AB (ref 0–44)
AST: 44 IU/L — AB (ref 0–40)
Albumin: 4.6 g/dL (ref 3.5–5.5)
BILIRUBIN TOTAL: 0.9 mg/dL (ref 0.0–1.2)
Bilirubin, Direct: 0.22 mg/dL (ref 0.00–0.40)
Total Protein: 6.9 g/dL (ref 6.0–8.5)

## 2015-04-30 LAB — LIPID PANEL
CHOLESTEROL TOTAL: 137 mg/dL (ref 100–199)
Chol/HDL Ratio: 3.7 ratio units (ref 0.0–5.0)
HDL: 37 mg/dL — ABNORMAL LOW (ref >39)
LDL CALC: 82 mg/dL (ref 0–99)
TRIGLYCERIDES: 90 mg/dL (ref 0–149)
VLDL CHOLESTEROL CAL: 18 mg/dL (ref 5–40)

## 2015-04-30 LAB — BASIC METABOLIC PANEL
BUN/Creatinine Ratio: 13 (ref 8–19)
BUN: 12 mg/dL (ref 6–20)
CALCIUM: 10 mg/dL (ref 8.7–10.2)
CHLORIDE: 103 mmol/L (ref 97–106)
CO2: 22 mmol/L (ref 18–29)
Creatinine, Ser: 0.9 mg/dL (ref 0.76–1.27)
GFR calc non Af Amer: 112 mL/min/{1.73_m2} (ref >59)
GFR, EST AFRICAN AMERICAN: 129 mL/min/{1.73_m2} (ref >59)
Glucose: 91 mg/dL (ref 65–99)
POTASSIUM: 4.6 mmol/L (ref 3.5–5.2)
SODIUM: 142 mmol/L (ref 136–144)

## 2015-05-02 ENCOUNTER — Encounter: Payer: Self-pay | Admitting: Family Medicine

## 2015-10-15 ENCOUNTER — Ambulatory Visit: Payer: 59 | Admitting: Family Medicine

## 2015-11-03 ENCOUNTER — Encounter: Payer: Self-pay | Admitting: Nurse Practitioner

## 2015-11-03 ENCOUNTER — Ambulatory Visit (INDEPENDENT_AMBULATORY_CARE_PROVIDER_SITE_OTHER): Payer: 59 | Admitting: Nurse Practitioner

## 2015-11-03 VITALS — BP 138/84 | Ht 76.0 in | Wt 388.0 lb

## 2015-11-03 DIAGNOSIS — R8290 Unspecified abnormal findings in urine: Secondary | ICD-10-CM | POA: Diagnosis not present

## 2015-11-03 DIAGNOSIS — G629 Polyneuropathy, unspecified: Secondary | ICD-10-CM | POA: Diagnosis not present

## 2015-11-03 DIAGNOSIS — F419 Anxiety disorder, unspecified: Secondary | ICD-10-CM

## 2015-11-03 LAB — POCT URINALYSIS DIPSTICK
PH UA: 6
SPEC GRAV UA: 1.025

## 2015-11-03 MED ORDER — CLONAZEPAM 0.5 MG PO TABS: ORAL_TABLET | ORAL | Status: DC

## 2015-11-04 LAB — CBC WITH DIFFERENTIAL/PLATELET
BASOS ABS: 0 10*3/uL (ref 0.0–0.2)
BASOS: 0 %
EOS (ABSOLUTE): 0.2 10*3/uL (ref 0.0–0.4)
Eos: 2 %
Hematocrit: 43.5 % (ref 37.5–51.0)
Hemoglobin: 14.3 g/dL (ref 12.6–17.7)
Immature Grans (Abs): 0 10*3/uL (ref 0.0–0.1)
Immature Granulocytes: 0 %
LYMPHS ABS: 2.2 10*3/uL (ref 0.7–3.1)
LYMPHS: 23 %
MCH: 28.3 pg (ref 26.6–33.0)
MCHC: 32.9 g/dL (ref 31.5–35.7)
MCV: 86 fL (ref 79–97)
MONOS ABS: 0.4 10*3/uL (ref 0.1–0.9)
Monocytes: 5 %
NEUTROS ABS: 6.5 10*3/uL (ref 1.4–7.0)
Neutrophils: 70 %
PLATELETS: 208 10*3/uL (ref 150–379)
RBC: 5.06 x10E6/uL (ref 4.14–5.80)
RDW: 13.7 % (ref 12.3–15.4)
WBC: 9.3 10*3/uL (ref 3.4–10.8)

## 2015-11-04 LAB — VITAMIN B12: VITAMIN B 12: 552 pg/mL (ref 211–946)

## 2015-11-04 LAB — FERRITIN: Ferritin: 247 ng/mL (ref 30–400)

## 2015-11-04 LAB — TSH: TSH: 1.24 u[IU]/mL (ref 0.450–4.500)

## 2015-11-05 ENCOUNTER — Encounter: Payer: Self-pay | Admitting: Nurse Practitioner

## 2015-11-05 NOTE — Progress Notes (Signed)
Subjective:  Presents for recheck on his anxiety. Occasionally takes Klonopin, not every day. Has changed his diet, eating more healthy. Walking almost daily. Also rides his elliptical. Still struggling to lose weight. Complaints of tingling and slight burning sensation in both feet. Began with the top of his feet feeling "hot". Began about a month ago. Also minimally tingling in both hands yesterday, none today. At end of visit mentioned that his urine has a strong odor. No dysuria urgency or frequency. No fever.  Objective:   BP 138/84 mmHg  Ht 6\' 4"  (1.93 m)  Wt 388 lb (175.996 kg)  BMI 47.25 kg/m2 NAD. Alert, oriented. Mildly anxious affect. Thoughts logical coherent and relevant. Lungs clear. Heart regular rate rhythm. Radial pulses strong and equal bilaterally. Hand strength 5+ bilateral. Phalen and Tinel's negative. Feet: No edema. Strong DP and PT pulses 5+ bilateral. Toes warm with normal capillary refill. Monofilament test normal.  Assessment:  Problem List Items Addressed This Visit      Other   Anxiety   Relevant Orders   CBC with Differential/Platelet (Completed)   TSH (Completed)   Ferritin (Completed)   Vitamin B12 (Completed)    Other Visit Diagnoses    Neuropathy (HCC)    -  Primary    Relevant Orders    CBC with Differential/Platelet (Completed)    TSH (Completed)    Ferritin (Completed)    Vitamin B12 (Completed)    Abnormal urine odor        Relevant Orders    POCT urinalysis dipstick (Completed)    CBC with Differential/Platelet (Completed)    TSH (Completed)    Ferritin (Completed)    Vitamin B12 (Completed)      Plan:  Meds ordered this encounter  Medications  . clonazePAM (KLONOPIN) 0.5 MG tablet    Sig: TAKE (1) TABLET BY MOUTH TWICE A DAY AS NEEDED.    Dispense:  60 tablet    Refill:  3    Order Specific Question:  Supervising Provider    Answer:  Merlyn AlbertLUKING, WILLIAM S [[id number]   Discussed importance of stress reduction. Encouraged continued healthy  diet and regular activity. Reduce sugar and simple carbs in his diet. Lab work pending. Return if symptoms worsen or fail to improve.

## 2015-11-23 ENCOUNTER — Other Ambulatory Visit: Payer: Self-pay | Admitting: Nurse Practitioner

## 2015-11-23 MED ORDER — GABAPENTIN 300 MG PO CAPS: 300.0000 mg | ORAL_CAPSULE | Freq: Two times a day (BID) | ORAL | Status: DC

## 2015-11-29 ENCOUNTER — Encounter: Payer: Self-pay | Admitting: Family Medicine

## 2015-11-29 ENCOUNTER — Ambulatory Visit (INDEPENDENT_AMBULATORY_CARE_PROVIDER_SITE_OTHER): Payer: 59 | Admitting: Family Medicine

## 2015-11-29 VITALS — BP 128/80 | Ht 76.0 in | Wt 393.0 lb

## 2015-11-29 DIAGNOSIS — R208 Other disturbances of skin sensation: Secondary | ICD-10-CM

## 2015-11-29 DIAGNOSIS — M545 Low back pain, unspecified: Secondary | ICD-10-CM

## 2015-11-29 DIAGNOSIS — R200 Anesthesia of skin: Secondary | ICD-10-CM

## 2015-11-29 MED ORDER — DICLOFENAC SODIUM 75 MG PO TBEC: DELAYED_RELEASE_TABLET | ORAL | Status: DC

## 2015-11-29 MED ORDER — CHLORZOXAZONE 500 MG PO TABS: ORAL_TABLET | ORAL | Status: DC

## 2015-11-29 NOTE — Progress Notes (Signed)
   Subjective:    Patient ID: Aaron Chambers, male    DOB: Aug 25, 1980, 35 y.o.   MRN: [id number]  Back Pain This is a new problem. Episode onset: 2 weeks. The pain is present in the thoracic spine. Exacerbated by: movement. Treatments tried: bc powder, tramadol. The treatment provided no relief.   Tingling/numbness in fingers on both hands. Taking gabapentin.  He relates that this is intermittent throughout the day multiple days over the past couple weeks. He states gabapentin made him feel a little bit funny and did not really seem to help all his symptoms He denies any numbness or pain down his leg He does relate severe back pain when he moves to grabbing pain denies abdominal pain chest pain shortness breath fever chills or sweats  Review of Systems  Musculoskeletal: Positive for back pain.       Objective:   Physical Exam Lungs clear heart regular positive Tinel's negative Phalen's strength in hands good subjective numbness in both hands negative straight leg raise lower back mild tenderness to palpation       Assessment & Plan:  Bilateral tingling in the hands this could well be carpal tunnel referral to neurology for nerve conduction study is recommended. I do not feel the patient has cervical nerve impingement I don't feel he has a stroke or heart disease causing this.  Severe mid to lower back pain has significant symptoms consistent with musculoskeletal worse with certain movements and with sneezing no straight leg raise I recommend muscle relaxers when at home anti-inflammatories over the next few weeks gentle range of motion exercises.  Use muscle relaxers only at home Stop gabapentin

## 2015-11-30 ENCOUNTER — Encounter: Payer: Self-pay | Admitting: Family Medicine

## 2015-12-24 ENCOUNTER — Ambulatory Visit (INDEPENDENT_AMBULATORY_CARE_PROVIDER_SITE_OTHER): Payer: 59 | Admitting: Neurology

## 2015-12-24 ENCOUNTER — Ambulatory Visit (INDEPENDENT_AMBULATORY_CARE_PROVIDER_SITE_OTHER): Payer: Self-pay | Admitting: Neurology

## 2015-12-24 DIAGNOSIS — R202 Paresthesia of skin: Secondary | ICD-10-CM | POA: Diagnosis not present

## 2015-12-24 DIAGNOSIS — Z0289 Encounter for other administrative examinations: Secondary | ICD-10-CM

## 2015-12-24 DIAGNOSIS — R200 Anesthesia of skin: Secondary | ICD-10-CM

## 2015-12-24 NOTE — Progress Notes (Signed)
  [facility]    Provider:  Dr Lucia Gaskins Referring Provider: Merlyn Albert, MD Primary Care Physician:  Lubertha South, MD  History:  Aaron Chambers is a 35 y.o. male here as a referral from Dr. Gerda Diss for paresthesias. Tingling and numbness in the fingers and arms and hands. Thumb cramps as well. Intermittent. No neck pain or radicular symptoms.   Summary  Nerve conduction studies were performed on the bilateral upper extremities:  The bilateral Median motor nerves showed normal conductions with normal F Wave latency The bilateral Ulnar motor nerves showed normal conductions with normal F Wave latency The bilateral second-digit Median sensory nerves were within normal limits The bilateral fifth-digit Ulnar sensory nerves were within normal limits  EMG Needle study was performed on bilateral upper extremity muscles:   The Deltoid, Triceps, Pronator Teres, Opponens Pollicis, First Dorsal interosseous muscles and lower cervical paraspinals were within normal limits bilaterally.   Conclusion: This is a normal study. No electrophysiologic evidence for ulnar or median neuropathy, peripheral polyneuropathy, cervical radiculopathy, muscle disorder.  Aaron Dean, MD  [facility] [address] [place], [place] [address]  Phone [phone] Fax [phone]

## 2015-12-27 NOTE — Progress Notes (Signed)
See procedure note.

## 2015-12-27 NOTE — Procedures (Signed)
[facility]    Provider:  Dr Lucia GaskinsAhern Referring Provider: Merlyn AlbertLuking, William S, MD Primary Care Physician:  Lubertha SouthSteve Luking, MD  History:  Aaron CoveyMatthew B Chambers is a 35 y.o. male here as a referral from Dr. Gerda DissLuking for paresthesias. Tingling and numbness in the fingers and arms and hands. Thumb cramps as well. Intermittent. No neck pain or radicular symptoms.   Summary  Nerve conduction studies were performed on the bilateral upper extremities:  The bilateral Median motor nerves showed normal conductions with normal F Wave latency The bilateral Ulnar motor nerves showed normal conductions with normal F Wave latency The bilateral second-digit Median sensory nerves were within normal limits The bilateral fifth-digit Ulnar sensory nerves were within normal limits  EMG Needle study was performed on bilateral upper extremity muscles:   The Deltoid, Triceps, Pronator Teres, Opponens Pollicis, First Dorsal interosseous muscles and lower cervical paraspinals were within normal limits bilaterally.   Conclusion: This is a normal study. No electrophysiologic evidence for ulnar or median neuropathy, peripheral polyneuropathy, cervical radiculopathy, muscle disorder.  Naomie DeanAntonia Ahern, MD  [facility] [address] [place], [place] [id number]  Phone [phone] Fax [phone]

## 2015-12-28 NOTE — Progress Notes (Signed)
Normal ncstudies , rec f u in office if symtoms perswisit

## 2016-01-06 ENCOUNTER — Telehealth: Payer: Self-pay | Admitting: Family Medicine

## 2016-01-06 NOTE — Telephone Encounter (Signed)
Patient was referred to [facility] for [facility].  He saw her the other day and he states he did not really get an answer from her on what was wrong with him.  He wants to know if he should be referred elsewhere or come back to our office?

## 2016-01-06 NOTE — Telephone Encounter (Signed)
ntsw f u here but not for two wks give time to fade since nerves fine on ner e conduction study

## 2016-01-06 NOTE — Telephone Encounter (Signed)
Notified patient follow up here but not for two weeks give time to fade since nerves fine on nerve conduction study. Patient verbalized understanding and transferred to front desk for appointment.

## 2016-01-19 ENCOUNTER — Encounter: Payer: Self-pay | Admitting: Family Medicine

## 2016-01-19 ENCOUNTER — Ambulatory Visit (INDEPENDENT_AMBULATORY_CARE_PROVIDER_SITE_OTHER): Payer: 59 | Admitting: Family Medicine

## 2016-01-19 VITALS — BP 122/80 | Ht 76.0 in | Wt 392.0 lb

## 2016-01-19 DIAGNOSIS — F458 Other somatoform disorders: Secondary | ICD-10-CM

## 2016-01-19 DIAGNOSIS — R202 Paresthesia of skin: Secondary | ICD-10-CM

## 2016-01-19 MED ORDER — CLONAZEPAM 0.5 MG PO TABS: ORAL_TABLET | ORAL | Status: DC

## 2016-01-19 NOTE — Progress Notes (Signed)
   Subjective:    Patient ID: Aaron Chambers, male    DOB: 12-23-1980, 35 y.o.   MRN: [id number] Patient arrives office for a very protracted discussion concerning symptoms Hand Pain  The incident occurred more than 1 week ago. There was no injury mechanism. The pain is present in the left hand and right hand. Quality: tingling, numbness. The pain does not radiate. The pain is mild. Associated symptoms include numbness and tingling. Nothing aggravates the symptoms. He has tried nothing for the symptoms. The treatment provided no relief.   Patient is having bilateral foot numbness and tingling also.  Often occurs hand and feet at the same time  With position of the hand pt had spell last week when left hand developed numbness, had to shhake out  Numbness a few times around the lips tingling  Generally numbness and tingling it's both arms and hands. Sometimes round face and lips. Sometimes both feet. Next  Often occurs at work. The sometimes at rest home. Next  Patient is noted anxiolytic seems to help calm down symptoms.  Had nerve conduction studies which are reviewed with patient today   Review of Systems  Neurological: Positive for tingling and numbness.  No headache, no major weight loss or weight gain, no chest pain no back pain abdominal pain no change in bowel habits complete ROS otherwise negative      Objective:   Physical Exam  Alert vitals stable, NAD. Blood pressure good on repeat. HEENT normal. Lungs clear. Heart regular rate and rhythm. Hands sensation intact pulses good reflux is good feet sensation intact pulses good reflux discussed      Assessment & Plan:  Impression intermittent paresthesias. Unresponsive the gabapentin. Negative nerve conduction studies. I think this is really associated patient's anxiety. Rationale discussed. Mild hyperventilation review weights 2 alkaline blood which weeks 2 paresthesias hands and feet around lips. I really don't think  further workup is necessary long frank discussion held. Patient in agreement. Plan Will use when necessary anxiolytics. Rationale discussed 25 minutes spent most in discussion

## 2016-01-24 DIAGNOSIS — Z029 Encounter for administrative examinations, unspecified: Secondary | ICD-10-CM

## 2016-02-10 ENCOUNTER — Telehealth: Payer: Self-pay | Admitting: Family Medicine

## 2016-02-10 NOTE — Telephone Encounter (Signed)
Pt called stating that he has ran out of his clonazePAM (KLONOPIN) 0.5 MG tablet.  Pt states that he is unable to get them filled for another 8 days. Pt states that he use to get 60 a month and was changed to 30 a month and was told to take them when his hands became numb. Please advise.    [facility]

## 2016-02-11 MED ORDER — CLONAZEPAM 0.5 MG PO TABS: ORAL_TABLET | ORAL | 5 refills | Status: DC

## 2016-02-11 NOTE — Telephone Encounter (Signed)
Prescription faxed to pharmacy. Patient notified. 

## 2016-02-11 NOTE — Telephone Encounter (Addendum)
Patient notified and stated he was going to call back and schedule an office visit to discuss his symptoms further with Dr Brett Canales.

## 2016-02-11 NOTE — Telephone Encounter (Signed)
Can go back up to forty five per no one up to bid prn plus three mpnthly ref

## 2016-02-14 ENCOUNTER — Telehealth: Payer: Self-pay | Admitting: Family Medicine

## 2016-02-14 NOTE — Telephone Encounter (Signed)
Seen 7/12

## 2016-02-14 NOTE — Telephone Encounter (Signed)
Nurse's-please talk with patient. Find out from his point of view what is going on and what he would like to have done. This patient recently saw Dr. Brett CanalesSteve. In addition to this patient saw neurology back in June had nerve conduction studies which were normal. I am not certain that neurology would add any additional information to add but we could do a follow-up visit with that. Also patient name benefit from a follow-up visit with Dr. Brett CanalesSteve to discuss serotonin reuptake inhibitors-this medication is noncontrolled and can be taken every day to help with anxiety symptoms- which can help with anxiety related issues

## 2016-02-14 NOTE — Telephone Encounter (Signed)
Discussed with pt. Pt transferred to front schedule follow up.

## 2016-02-14 NOTE — Telephone Encounter (Signed)
Left message to return call 

## 2016-02-14 NOTE — Telephone Encounter (Signed)
Pt states is numbness and tingling is not getting any better. He is having more of a shock now and his grip is getting to the point that he can't grip anything. Feels almost like shock from a nine volt battery, up his fingers through his arms and down his back.   He feels maybe it may be more than anxiety but wants your opinion on him possibly seeing a specialist   Please advise

## 2016-02-25 ENCOUNTER — Ambulatory Visit (INDEPENDENT_AMBULATORY_CARE_PROVIDER_SITE_OTHER): Payer: 59 | Admitting: Family Medicine

## 2016-02-25 VITALS — BP 128/82 | Ht 76.0 in | Wt 369.0 lb

## 2016-02-25 DIAGNOSIS — F419 Anxiety disorder, unspecified: Secondary | ICD-10-CM | POA: Diagnosis not present

## 2016-02-25 DIAGNOSIS — R202 Paresthesia of skin: Secondary | ICD-10-CM

## 2016-02-25 MED ORDER — BUPROPION HCL ER (SR) 150 MG PO TB12: ORAL_TABLET | ORAL | 5 refills | Status: DC

## 2016-02-25 NOTE — Progress Notes (Signed)
   Subjective:    Patient ID: Aaron CoveyMatthew B Ditmer, male    DOB: 03-29-1981, 35 y.o.   MRN: [id number] Patient arrives office for very protracted discussion HPI Continues to have burning and paresthesia-like symptoms in his arm some time dryness his face. He is been doing a lot of research and wonders if he needs a neurology workup.  No weakness.  Notes when it does flareup clonazepam definitely helps. Next  Would prefer not to get "addicted" to clonazepam. Wonders if he can take something daily.   Patient arrives for a follow up on anxiety. Patient would like to discuss his symptoms- the tingling and burning in his hand has gotten bad and having to take klonopin 2 times a day to help.  Pt notes ongoing challenges with tingling and electrical type feeling  In both arms   Feeling like an electrical current sensaion  Pt would prefer to not have to take klonopin eg if at all possible   Review of Systems No headache, no major weight loss or weight gain, no chest pain no back pain abdominal pain no change in bowel habits complete ROS otherwise negative     Objective:   Physical Exam   Alert vitals stable, NAD. Blood pressure good on repeat. HEENT normal. Lungs clear. Heart regular rate and rhythm. Sensory exam currently intact. Deep tendon reflexes strength sensation all intact and extremities     Assessment & Plan:  Impression paresthesias directly related to spells of anxiety. #2 generalized anxiety disorder not a lot in terms of depression currently plan long discussion regarding the physical symptomatology as relates to his anxiety. Do not feel a complete neurology workup would be helpful rationale discussed. Initiate Wellbutrin. 25 minutes spent most in discussion. Patient has lost weight. Diet exercise discussed in encourage recheck in several months

## 2016-03-10 ENCOUNTER — Telehealth: Payer: Self-pay | Admitting: Family Medicine

## 2016-03-10 NOTE — Telephone Encounter (Signed)
wis 

## 2016-03-10 NOTE — Telephone Encounter (Signed)
Spoke with patient and informed her per Dr.Steve Luking- We are sending a prescription for Head gear for CPAP to [facility]. Patient verbalized understanding.

## 2016-03-10 NOTE — Telephone Encounter (Signed)
Let's do 

## 2016-03-10 NOTE — Telephone Encounter (Signed)
Patient is requesting a prescription to be called in for a new head gear piece for his C-Pap machine.  His velcro doesn't hold anymore.  [facility]

## 2016-04-21 ENCOUNTER — Encounter: Payer: 59 | Admitting: Family Medicine

## 2016-04-26 ENCOUNTER — Encounter: Payer: 59 | Admitting: Family Medicine

## 2016-05-15 ENCOUNTER — Ambulatory Visit (INDEPENDENT_AMBULATORY_CARE_PROVIDER_SITE_OTHER): Payer: 59 | Admitting: Family Medicine

## 2016-05-15 ENCOUNTER — Encounter: Payer: Self-pay | Admitting: Family Medicine

## 2016-05-15 VITALS — BP 128/78 | Ht 76.0 in | Wt 374.2 lb

## 2016-05-15 DIAGNOSIS — M79671 Pain in right foot: Secondary | ICD-10-CM | POA: Diagnosis not present

## 2016-05-15 DIAGNOSIS — R200 Anesthesia of skin: Secondary | ICD-10-CM | POA: Diagnosis not present

## 2016-05-15 DIAGNOSIS — Z23 Encounter for immunization: Secondary | ICD-10-CM | POA: Diagnosis not present

## 2016-05-15 DIAGNOSIS — R748 Abnormal levels of other serum enzymes: Secondary | ICD-10-CM

## 2016-05-15 DIAGNOSIS — Z0000 Encounter for general adult medical examination without abnormal findings: Secondary | ICD-10-CM

## 2016-05-15 DIAGNOSIS — F419 Anxiety disorder, unspecified: Secondary | ICD-10-CM | POA: Diagnosis not present

## 2016-05-15 DIAGNOSIS — R202 Paresthesia of skin: Secondary | ICD-10-CM | POA: Diagnosis not present

## 2016-05-15 MED ORDER — BUPROPION HCL ER (SR) 150 MG PO TB12: ORAL_TABLET | ORAL | 5 refills | Status: DC

## 2016-05-15 NOTE — Progress Notes (Signed)
Subjective:    Patient ID: Aaron Chambers, male    DOB: [birth year], 35 y.o.   MRN: [id number]  HPI The patient comes in today for a wellness visit.    A review of their health history was completed.  A review of medications was also completed.  Any needed refills; None   Eating habits: Patient states are eating habits are fairly healthy. Has been doing healthy diet plan.  Falls/  MVA accidents in past few months:  None   Regular exercise: Patient states walks 1.5 miles  3-4 weekly.   Specialist pt sees on regular basis: None   Preventative health issues were discussed.   Additional concerns: Patient has concerns of tingling in hands and feet and pain to right foot.   Gets fair amnt of exercise with job, Results for orders placed or performed in visit on 11/03/15  CBC with Differential/Platelet  Result Value Ref Range   WBC 9.3 3.4 - 10.8 x10E3/uL   RBC 5.06 4.14 - 5.80 x10E6/uL   Hemoglobin 14.3 12.6 - 17.7 g/dL   Hematocrit 91.443.5 78.237.5 - 51.0 %   MCV 86 79 - 97 fL   MCH 28.3 26.6 - 33.0 pg   MCHC 32.9 31.5 - 35.7 g/dL   RDW 95.613.7 21.312.3 - 08.615.4 %   Platelets 208 150 - 379 x10E3/uL   Neutrophils 70 %   Lymphs 23 %   Monocytes 5 %   Eos 2 %   Basos 0 %   Neutrophils Absolute 6.5 1.4 - 7.0 x10E3/uL   Lymphocytes Absolute 2.2 0.7 - 3.1 x10E3/uL   Monocytes Absolute 0.4 0.1 - 0.9 x10E3/uL   EOS (ABSOLUTE) 0.2 0.0 - 0.4 x10E3/uL   Basophils Absolute 0.0 0.0 - 0.2 x10E3/uL   Immature Granulocytes 0 %   Immature Grans (Abs) 0.0 0.0 - 0.1 x10E3/uL  TSH  Result Value Ref Range   TSH 1.240 0.450 - 4.500 uIU/mL  Ferritin  Result Value Ref Range   Ferritin 247 30 - 400 ng/mL  Vitamin B12  Result Value Ref Range   Vitamin B-12 552 211 - 946 pg/mL  POCT urinalysis dipstick  Result Value Ref Range   Color, UA     Clarity, UA     Glucose, UA     Bilirubin, UA     Ketones, UA     Spec Grav, UA 1.025    Blood, UA     pH, UA 6.0    Protein, UA     Urobilinogen, UA      Nitrite, UA     Leukocytes, UA  Negative   Walking reg three to four times per wk, walks thirty min or more, and uses elliptical  Tingling still a fair amnt, notes around hands and feet, not bad but still there, geerneally hand s and feet  occas pain i Review of Systems  Constitutional: Negative for activity change, appetite change and fever.       Continued excess weight patient gain some and now has lost back to where he was 1 uric  HENT: Negative for congestion and rhinorrhea.   Eyes: Negative for discharge.  Respiratory: Negative for cough and wheezing.   Cardiovascular: Negative for chest pain.  Gastrointestinal: Negative for abdominal pain, blood in stool and vomiting.  Genitourinary: Negative for difficulty urinating and frequency.  Musculoskeletal: Negative for neck pain.  Skin: Negative for rash.  Allergic/Immunologic: Negative for environmental allergies and food allergies.  Neurological: Negative for weakness and headaches.  Psychiatric/Behavioral: Negative for agitation.  All other systems reviewed and are negative.      Objective:   Physical Exam  Constitutional: He appears well-developed and well-nourished.  Very high BMI, morbid obesity present  HENT:  Head: Normocephalic and atraumatic.  Right Ear: External ear normal.  Left Ear: External ear normal.  Nose: Nose normal.  Mouth/Throat: Oropharynx is clear and moist.  Eyes: EOM are normal. Pupils are equal, round, and reactive to light.  Neck: Normal range of motion. Neck supple. No thyromegaly present.  Cardiovascular: Normal rate, regular rhythm and normal heart sounds.   No murmur heard. Pulmonary/Chest: Effort normal and breath sounds normal. No respiratory distress. He has no wheezes.  Abdominal: Soft. Bowel sounds are normal. He exhibits no distension and no mass. There is no tenderness.  Genitourinary: Penis normal.  Musculoskeletal: Normal range of motion. He exhibits no edema.  Lymphadenopathy:      He has no cervical adenopathy.  Neurological: He is alert. He exhibits normal muscle tone.  Skin: Skin is warm and dry. No erythema.  Psychiatric: He has a normal mood and affect. His behavior is normal. Judgment normal.  Vitals reviewed.         Assessment & Plan:  Impression 1 well adult exam #2 morbid obesity discussed patient states once again he plans to lose substantial weight this coming year #2 elevated liver enzymes. Likely fatty liver discussed patient increasingly worried about this once to make sure he is to one or 2% he does not have pre-cirrhosis condition discussed the approach this #3 ongoing paresthesias likely associated with #4 #4 anxiety plan appropriate blood work. We likely will be doing ultrasound to assess hepatic steatosis and burning pre-cirrhosis element discussed. Await liver enzymes. Other medications to maintain diet exercise discussed. Recheck in 6 months. Also sudden onset of pain in right foot intermittently discussed no evidence of active inflammation. Will check a uric acid.

## 2016-06-10 LAB — BASIC METABOLIC PANEL
BUN/Creatinine Ratio: 17 (ref 9–20)
BUN: 15 mg/dL (ref 6–20)
CALCIUM: 9.7 mg/dL (ref 8.7–10.2)
CO2: 25 mmol/L (ref 18–29)
CREATININE: 0.86 mg/dL (ref 0.76–1.27)
Chloride: 101 mmol/L (ref 96–106)
GFR calc Af Amer: 131 mL/min/{1.73_m2} (ref >59)
GFR, EST NON AFRICAN AMERICAN: 113 mL/min/{1.73_m2} (ref >59)
Glucose: 97 mg/dL (ref 65–99)
POTASSIUM: 4.8 mmol/L (ref 3.5–5.2)
Sodium: 141 mmol/L (ref 134–144)

## 2016-06-10 LAB — HEPATIC FUNCTION PANEL
ALBUMIN: 4.5 g/dL (ref 3.5–5.5)
ALT: 44 IU/L (ref 0–44)
AST: 28 IU/L (ref 0–40)
Alkaline Phosphatase: 89 IU/L (ref 39–117)
Bilirubin Total: 0.7 mg/dL (ref 0.0–1.2)
Bilirubin, Direct: 0.17 mg/dL (ref 0.00–0.40)
TOTAL PROTEIN: 6.9 g/dL (ref 6.0–8.5)

## 2016-06-10 LAB — LIPID PANEL
Chol/HDL Ratio: 3.8 ratio units (ref 0.0–5.0)
Cholesterol, Total: 169 mg/dL (ref 100–199)
HDL: 45 mg/dL (ref >39)
LDL CALC: 108 mg/dL — AB (ref 0–99)
Triglycerides: 81 mg/dL (ref 0–149)
VLDL CHOLESTEROL CAL: 16 mg/dL (ref 5–40)

## 2016-06-10 LAB — URIC ACID: Uric Acid: 8.4 mg/dL (ref 3.7–8.6)

## 2016-07-24 ENCOUNTER — Ambulatory Visit (INDEPENDENT_AMBULATORY_CARE_PROVIDER_SITE_OTHER): Payer: 59 | Admitting: Family Medicine

## 2016-07-24 ENCOUNTER — Encounter: Payer: Self-pay | Admitting: Family Medicine

## 2016-07-24 VITALS — BP 138/90 | Temp 98.6°F | Ht 76.0 in | Wt 389.0 lb

## 2016-07-24 DIAGNOSIS — J329 Chronic sinusitis, unspecified: Secondary | ICD-10-CM

## 2016-07-24 DIAGNOSIS — J310 Chronic rhinitis: Secondary | ICD-10-CM

## 2016-07-24 MED ORDER — AMOXICILLIN-POT CLAVULANATE 875-125 MG PO TABS: 1.0000 | ORAL_TABLET | Freq: Two times a day (BID) | ORAL | 0 refills | Status: AC

## 2016-07-24 NOTE — Progress Notes (Signed)
   Subjective:    Patient ID: Aaron Chambers, male    DOB: Mar 08, 1981, 36 y.o.   MRN: [id number]  Sinusitis  This is a new problem. Episode onset: 3 weeks. Associated symptoms include congestion and a sore throat. Treatments tried: otc meds.   Three wks ago started as cong and dranage  Quickly moved into sig headache and inflammed throat  Lasted thru the holidays   Still sig dranage and ear pain  And congested and irritated  Headache frontal in nature worse with change of position. Throat more painful morning. Chronic drainage   Review of Systems  HENT: Positive for congestion and sore throat.        Objective:   Physical Exam  Alert, mild malaise. Hydration good Vitals stable. frontal/ maxillary tenderness evident positive nasal congestion. pharynx normal neck supple  lungs clear/no crackles or wheezes. heart regular in rhythm       Assessment & Plan:  Impression rhinosinusitis likely post viral, discussed with patient. plan antibiotics prescribed. Questions answered. Symptomatic care discussed. warning signs discussed. WSL

## 2016-08-28 ENCOUNTER — Other Ambulatory Visit: Payer: Self-pay | Admitting: *Deleted

## 2016-08-28 ENCOUNTER — Telehealth: Payer: Self-pay | Admitting: Family Medicine

## 2016-08-28 MED ORDER — CLONAZEPAM 0.5 MG PO TABS: ORAL_TABLET | ORAL | 3 refills | Status: DC

## 2016-08-28 NOTE — Telephone Encounter (Signed)
Ok plus three monthly ref 

## 2016-08-28 NOTE — Telephone Encounter (Signed)
rx faxed. Pt  Notified.

## 2016-08-28 NOTE — Telephone Encounter (Signed)
Pt is requesting a refill on his clonazePAM (KLONOPIN) 0.5 MG tablet    [facility]

## 2016-11-13 ENCOUNTER — Encounter: Payer: Self-pay | Admitting: Family Medicine

## 2016-11-13 ENCOUNTER — Ambulatory Visit: Payer: 59 | Admitting: Family Medicine

## 2016-11-29 ENCOUNTER — Other Ambulatory Visit: Payer: Self-pay | Admitting: Family Medicine

## 2017-01-04 ENCOUNTER — Encounter: Payer: Self-pay | Admitting: Family Medicine

## 2017-01-04 ENCOUNTER — Ambulatory Visit (INDEPENDENT_AMBULATORY_CARE_PROVIDER_SITE_OTHER): Payer: BLUE CROSS/BLUE SHIELD | Admitting: Family Medicine

## 2017-01-04 VITALS — BP 138/82 | Ht 76.0 in | Wt 393.2 lb

## 2017-01-04 DIAGNOSIS — F419 Anxiety disorder, unspecified: Secondary | ICD-10-CM | POA: Diagnosis not present

## 2017-01-04 DIAGNOSIS — F5101 Primary insomnia: Secondary | ICD-10-CM

## 2017-01-04 DIAGNOSIS — G4733 Obstructive sleep apnea (adult) (pediatric): Secondary | ICD-10-CM

## 2017-01-04 DIAGNOSIS — N200 Calculus of kidney: Secondary | ICD-10-CM | POA: Diagnosis not present

## 2017-01-04 MED ORDER — CLONAZEPAM 0.5 MG PO TABS: ORAL_TABLET | ORAL | 5 refills | Status: DC

## 2017-01-04 MED ORDER — ESCITALOPRAM OXALATE 10 MG PO TABS: 10.0000 mg | ORAL_TABLET | Freq: Every day | ORAL | 5 refills | Status: DC

## 2017-01-04 NOTE — Progress Notes (Signed)
   Subjective:    Patient ID: Aaron CoveyMatthew B Coltrain, male    DOB: [birth year], 36 y.o.   MRN: [id number]  HPI Patient arrives for a follow and anxiety. Patient is wondering if he can try a different med than the wellbutrin- doesn't seem to be working well.  Exercises some  Diet is sos so   Finds himself at times eating too much, and realizes her overeats sometimes  CPAP helping a lot, improved energy, uses nightly, hels definitely  Asthma vry stable, has hnot had to use an inhaler at all,once per three mo or so   Pt has hx of  Hyperventilation and paraesthesias, wellbutrinnot helping as much as he had hoped. Feels do, Still gets numbness and tingling at times.   Sig loss in the family  Often has sadthoughts  Zero thoughts of hurting himself   Ongoing challenges with sleep apnea. Patient states she Definitely helps. Uses faithfully.  Review of Systems No headache, no major weight loss or weight gain, no chest pain no back pain abdominal pain no change in bowel habits complete ROS otherwise negative     Objective:   Physical Exam  BMI 47  Alert and oriented, vitals reviewed and stable, NAD ENT-TM's and ext canals WNL bilat via otoscopic exam Soft palate, tonsils and post pharynx WNL via oropharyngeal exam Neck-symmetric, no masses; thyroid nonpalpable and nontender Pulmonary-no tachypnea or accessory muscle use; Clear without wheezes via auscultation Card--no abnrml murmurs, rhythm reg and rate WNL Carotid pulses symmetric, without bruits       Assessment & Plan:  Impression #1 asthma clinically stable discussed maintain same approach #2 insomnia discussed the 3 /anxiety clinically somewhat not stable more really anxiety for this patient., Long discussion held. Patient would like to try a different nature. Wellbutrin stop. Lexapro initiated side effects benefits discussed #4 obstructive sleep apnea present ongoing importance of management discussed

## 2017-02-18 ENCOUNTER — Encounter (HOSPITAL_COMMUNITY): Payer: Self-pay | Admitting: *Deleted

## 2017-02-18 ENCOUNTER — Emergency Department (HOSPITAL_COMMUNITY): Payer: Commercial Managed Care - PPO

## 2017-02-18 ENCOUNTER — Emergency Department (HOSPITAL_COMMUNITY)
Admission: EM | Admit: 2017-02-18 | Discharge: 2017-02-18 | Disposition: A | Discharge status: Home / Self Care | Payer: Commercial Managed Care - PPO | Attending: Emergency Medicine | Admitting: Emergency Medicine

## 2017-02-18 DIAGNOSIS — J45909 Unspecified asthma, uncomplicated: Secondary | ICD-10-CM | POA: Insufficient documentation

## 2017-02-18 DIAGNOSIS — Z79899 Other long term (current) drug therapy: Secondary | ICD-10-CM | POA: Insufficient documentation

## 2017-02-18 DIAGNOSIS — Z7982 Long term (current) use of aspirin: Secondary | ICD-10-CM | POA: Insufficient documentation

## 2017-02-18 DIAGNOSIS — N200 Calculus of kidney: Secondary | ICD-10-CM | POA: Diagnosis not present

## 2017-02-18 DIAGNOSIS — R1032 Left lower quadrant pain: Secondary | ICD-10-CM | POA: Diagnosis present

## 2017-02-18 HISTORY — DX: Calculus of kidney: N20.0

## 2017-02-18 LAB — CBC WITH DIFFERENTIAL/PLATELET
Basophils Absolute: 0 10*3/uL (ref 0.0–0.1)
Basophils Relative: 0 %
Eosinophils Absolute: 0 10*3/uL (ref 0.0–0.7)
Eosinophils Relative: 0 %
HEMATOCRIT: 43.2 % (ref 39.0–52.0)
Hemoglobin: 14.2 g/dL (ref 13.0–17.0)
LYMPHS PCT: 10 %
Lymphs Abs: 1.4 10*3/uL (ref 0.7–4.0)
MCH: 28.1 pg (ref 26.0–34.0)
MCHC: 32.9 g/dL (ref 30.0–36.0)
MCV: 85.5 fL (ref 78.0–100.0)
MONO ABS: 0.5 10*3/uL (ref 0.1–1.0)
MONOS PCT: 3 %
NEUTROS ABS: 11.7 10*3/uL — AB (ref 1.7–7.7)
Neutrophils Relative %: 87 %
Platelets: 201 10*3/uL (ref 150–400)
RBC: 5.05 MIL/uL (ref 4.22–5.81)
RDW: 14 % (ref 11.5–15.5)
WBC: 13.5 10*3/uL — ABNORMAL HIGH (ref 4.0–10.5)

## 2017-02-18 LAB — COMPREHENSIVE METABOLIC PANEL
ALK PHOS: 90 U/L (ref 38–126)
ALT: 70 U/L — ABNORMAL HIGH (ref 17–63)
AST: 47 U/L — ABNORMAL HIGH (ref 15–41)
Albumin: 4.6 g/dL (ref 3.5–5.0)
Anion gap: 10 (ref 5–15)
BILIRUBIN TOTAL: 1 mg/dL (ref 0.3–1.2)
BUN: 17 mg/dL (ref 6–20)
CALCIUM: 9.8 mg/dL (ref 8.9–10.3)
CO2: 23 mmol/L (ref 22–32)
Chloride: 111 mmol/L (ref 101–111)
Creatinine, Ser: 1.03 mg/dL (ref 0.61–1.24)
GFR calc Af Amer: >60 mL/min (ref >60)
GFR calc non Af Amer: >60 mL/min (ref >60)
GLUCOSE: 143 mg/dL — AB (ref 65–99)
Potassium: 4.5 mmol/L (ref 3.5–5.1)
Sodium: 144 mmol/L (ref 135–145)
TOTAL PROTEIN: 8 g/dL (ref 6.5–8.1)

## 2017-02-18 LAB — URINALYSIS, ROUTINE W REFLEX MICROSCOPIC
Bilirubin Urine: NEGATIVE
GLUCOSE, UA: NEGATIVE mg/dL
KETONES UR: 5 mg/dL — AB
Leukocytes, UA: NEGATIVE
Nitrite: NEGATIVE
PH: 5 (ref 5.0–8.0)
Protein, ur: 30 mg/dL — AB
Specific Gravity, Urine: 1.025 (ref 1.005–1.030)

## 2017-02-18 MED ORDER — SODIUM CHLORIDE 0.9 % IV BOLUS (SEPSIS)
1000.0000 mL | Freq: Once | INTRAVENOUS | Status: AC
  Administered 2017-02-18: 1000 mL via INTRAVENOUS

## 2017-02-18 MED ORDER — KETOROLAC TROMETHAMINE 30 MG/ML IJ SOLN
30.0000 mg | Freq: Once | INTRAMUSCULAR | Status: AC
  Administered 2017-02-18: 30 mg via INTRAVENOUS
  Filled 2017-02-18: qty 1

## 2017-02-18 MED ORDER — OXYCODONE-ACETAMINOPHEN 5-325 MG PO TABS: 1.0000 | ORAL_TABLET | Freq: Four times a day (QID) | ORAL | 0 refills | Status: DC | PRN

## 2017-02-18 MED ORDER — ONDANSETRON 4 MG PO TBDP: ORAL_TABLET | ORAL | 0 refills | Status: DC

## 2017-02-18 MED ORDER — ONDANSETRON HCL 4 MG/2ML IJ SOLN
4.0000 mg | Freq: Once | INTRAMUSCULAR | Status: AC
  Administered 2017-02-18: 4 mg via INTRAVENOUS
  Filled 2017-02-18: qty 2

## 2017-02-18 MED ORDER — HYDROMORPHONE HCL 1 MG/ML IJ SOLN
1.0000 mg | Freq: Once | INTRAMUSCULAR | Status: AC
  Administered 2017-02-18: 1 mg via INTRAVENOUS
  Filled 2017-02-18: qty 1

## 2017-02-18 MED ORDER — ONDANSETRON HCL 4 MG/2ML IJ SOLN
4.0000 mg | Freq: Once | INTRAMUSCULAR | Status: AC
Start: 2017-02-18 — End: 2017-02-18
  Administered 2017-02-18: 4 mg via INTRAVENOUS
  Filled 2017-02-18: qty 2

## 2017-02-18 MED ORDER — HYDROMORPHONE HCL 1 MG/ML IJ SOLN
0.5000 mg | Freq: Once | INTRAMUSCULAR | Status: AC
  Administered 2017-02-18: 0.5 mg via INTRAVENOUS
  Filled 2017-02-18: qty 1

## 2017-02-18 MED ORDER — TAMSULOSIN HCL 0.4 MG PO CAPS: 0.4000 mg | ORAL_CAPSULE | Freq: Every day | ORAL | 0 refills | Status: DC

## 2017-02-18 NOTE — ED Notes (Signed)
Pt returned from CT °

## 2017-02-18 NOTE — ED Notes (Signed)
Patient transported to CT 

## 2017-02-18 NOTE — ED Provider Notes (Signed)
[facility] Provider Note   CSN: [id number] Arrival date & time: 02/18/17  2026     History   Chief Complaint Chief Complaint  Patient presents with  . Flank Pain    HPI Aaron Chambers is a 36 y.o. male.  Patient complains of left flank and left lower quadrant pain. Patient has a history of kidney stones.   The history is provided by the patient.  Flank Pain  This is a new problem. The current episode started 12 to 24 hours ago. The problem occurs constantly. The problem has not changed since onset.Pertinent negatives include no chest pain, no abdominal pain and no headaches. Nothing aggravates the symptoms. Nothing relieves the symptoms. He has tried nothing for the symptoms. The treatment provided no relief.    Past Medical History:  Diagnosis Date  . Allergic rhinitis   . Anxiety   . Arrhythmia   . Asthma   . Kidney stones   . Obesity     Patient Active Problem List   Diagnosis Date Noted  . Anxiety 11/03/2015  . Morbid obesity (HCC) 03/23/2014  . Obstructive sleep apnea 10/04/2013  . Kidney stones 11/22/2012  . Insomnia 11/22/2012  . Asthma, chronic 11/22/2012    Past Surgical History:  Procedure Laterality Date  . CARDIAC ELECTROPHYSIOLOGY STUDY AND ABLATION    . VASCULAR SURGERY         Home Medications    Prior to Admission medications   Medication Sig Start Date End Date Taking? Authorizing Provider  aspirin EC 325 MG tablet Take 650 mg by mouth daily as needed.   Yes [provider]  Aspirin-Salicylamide-Caffeine (ARTHRITIS STRENGTH BC POWDER PO) Take 1 packet by mouth daily as needed.   Yes [provider]  escitalopram (LEXAPRO) 10 MG tablet Take 1 tablet (10 mg total) by mouth daily. 01/04/17  Yes Merlyn Albert, MD  Multiple Vitamin (MULTIVITAMIN WITH MINERALS) TABS tablet Take 1 tablet by mouth daily.   Yes [provider]  clonazePAM (KLONOPIN) 0.5 MG tablet TAKE (1) TABLET BY MOUTH UP TO BID PRN  01/04/17   Merlyn Albert, MD  ondansetron (ZOFRAN ODT) 4 MG disintegrating tablet 4mg  ODT q4 hours prn nausea/vomit 02/18/17   Bethann Berkshire, MD  ondansetron (ZOFRAN ODT) 4 MG disintegrating tablet 4mg  ODT q4 hours prn nausea/vomit 02/18/17   Bethann Berkshire, MD  oxyCODONE-acetaminophen (PERCOCET/ROXICET) 5-325 MG tablet Take 1 tablet by mouth every 6 (six) hours as needed. 02/18/17   Bethann Berkshire, MD  oxyCODONE-acetaminophen (PERCOCET/ROXICET) 5-325 MG tablet Take 1 tablet by mouth every 6 (six) hours as needed. 02/18/17   Bethann Berkshire, MD  tamsulosin (FLOMAX) 0.4 MG CAPS capsule Take 1 capsule (0.4 mg total) by mouth daily. 02/18/17   Bethann Berkshire, MD    Family History No family history on file.  Social History Social History  Substance Use Topics  . Smoking status: Never Smoker  . Smokeless tobacco: Former Neurosurgeon  . Alcohol use No     Allergies   Patient has no known allergies.   Review of Systems Review of Systems  Constitutional: Negative for appetite change and fatigue.  HENT: Negative for congestion, ear discharge and sinus pressure.   Eyes: Negative for discharge.  Respiratory: Negative for cough.   Cardiovascular: Negative for chest pain.  Gastrointestinal: Negative for abdominal pain and diarrhea.  Genitourinary: Positive for flank pain. Negative for frequency and hematuria.  Musculoskeletal: Negative for back pain.  Skin: Negative for rash.  Neurological: Negative  for seizures and headaches.  Psychiatric/Behavioral: Negative for hallucinations.     Physical Exam Updated Vital Signs BP (!) 148/95 (BP Location: Right Arm)   Pulse 85   Temp 98.4 F (36.9 C) (Oral)   Resp 18   Ht 6\' 4"  (1.93 m)   Wt (!) 176.9 kg (390 lb)   SpO2 97%   BMI 47.47 kg/m   Physical Exam  Constitutional: He is oriented to person, place, and time. He appears well-developed.  HENT:  Head: Normocephalic.  Eyes: Conjunctivae and EOM are normal. No scleral icterus.  Neck: Neck  supple. No thyromegaly present.  Cardiovascular: Normal rate and regular rhythm.  Exam reveals no gallop and no friction rub.   No murmur heard. Pulmonary/Chest: No stridor. He has no wheezes. He has no rales. He exhibits no tenderness.  Abdominal: He exhibits no distension. There is tenderness. There is no rebound.  Musculoskeletal: Normal range of motion. He exhibits no edema.  Tender left flank  Lymphadenopathy:    He has no cervical adenopathy.  Neurological: He is oriented to person, place, and time. He exhibits normal muscle tone. Coordination normal.  Skin: No rash noted. No erythema.  Psychiatric: He has a normal mood and affect. His behavior is normal.     ED Treatments / Results  Labs (all labs ordered are listed, but only abnormal results are displayed) Labs Reviewed  URINALYSIS, ROUTINE W REFLEX MICROSCOPIC - Abnormal; Notable for the following:       Result Value   APPearance HAZY (*)    Hgb urine dipstick LARGE (*)    Ketones, ur 5 (*)    Protein, ur 30 (*)    Bacteria, UA RARE (*)    Squamous Epithelial / LPF 0-5 (*)    All other components within normal limits  CBC WITH DIFFERENTIAL/PLATELET - Abnormal; Notable for the following:    WBC 13.5 (*)    Neutro Abs 11.7 (*)    All other components within normal limits  COMPREHENSIVE METABOLIC PANEL - Abnormal; Notable for the following:    Glucose, Bld 143 (*)    AST 47 (*)    ALT 70 (*)    All other components within normal limits    EKG  EKG Interpretation None       Radiology Ct Renal Stone Study  Result Date: 02/18/2017 CLINICAL DATA:  Left-sided flank pain and lower abdominal pain EXAM: CT ABDOMEN AND PELVIS WITHOUT CONTRAST TECHNIQUE: Multidetector CT imaging of the abdomen and pelvis was performed following the standard protocol without IV contrast. COMPARISON:  Report 11/23/2000 FINDINGS: Lower chest: Lung bases demonstrate no acute consolidation or pleural effusion. Normal heart size. Hepatobiliary:  No focal liver abnormality is seen. No gallstones, gallbladder wall thickening, or biliary dilatation. Pancreas: Unremarkable. No pancreatic ductal dilatation or surrounding inflammatory changes. Spleen: Normal in size without focal abnormality. Adrenals/Urinary Tract: Adrenal glands are within normal limits. Multiple intrarenal calculi bilaterally. Largest on the right is seen within the mid to upper pole and measures 6 mm. There is a 4 x 5 mm distal ureteral stone on the left, about 1 cm proximal to the left UVJ. Minimal left renal pelvis dilatation but no significant hydroureter. Stomach/Bowel: Stomach is within normal limits. Appendix appears normal. No evidence of bowel wall thickening, distention, or inflammatory changes. Vascular/Lymphatic: No significant vascular findings are present. No enlarged abdominal or pelvic lymph nodes. Reproductive: Prostate is unremarkable. Other: Negative for free air or free fluid.  Fat in the umbilicus. Musculoskeletal: Degenerative  changes. No acute or suspicious bone lesion. IMPRESSION: 1. 4 x 5 mm distal ureteral stone on the left, about 1 cm proximal to the left UVJ. Minimal dilatation of left renal pelvis but no significant hydroureter 2. Multiple intrarenal calculi bilaterally Electronically Signed   By: Jasmine Pang M.D.   On: 02/18/2017 21:55    Procedures Procedures (including critical care time)  Medications Ordered in ED Medications  ketorolac (TORADOL) 30 MG/ML injection 30 mg (30 mg Intravenous Given 02/18/17 2047)  ondansetron (ZOFRAN) injection 4 mg (4 mg Intravenous Given 02/18/17 2047)  HYDROmorphone (DILAUDID) injection 1 mg (1 mg Intravenous Given 02/18/17 2047)  sodium chloride 0.9 % bolus 1,000 mL (0 mLs Intravenous Stopped 02/18/17 2315)  HYDROmorphone (DILAUDID) injection 0.5 mg (0.5 mg Intravenous Given 02/18/17 2222)  HYDROmorphone (DILAUDID) injection 1 mg (1 mg Intravenous Given 02/18/17 2324)  ondansetron (ZOFRAN) injection 4 mg (4 mg  Intravenous Given 02/18/17 2324)     Initial Impression / Assessment and Plan / ED Course  I have reviewed the triage vital signs and the nursing notes.  Pertinent labs & imaging results that were available during my care of the patient were reviewed by me and considered in my medical decision making (see chart for details).     Patient has a 4 mm stone left UVJ. He'll be sent home with Percocets and Zofran and Flomax and will follow-up with [facility]  Final Clinical Impressions(s) / ED Diagnoses   Final diagnoses:  Kidney stone    New Prescriptions New Prescriptions   ONDANSETRON (ZOFRAN ODT) 4 MG DISINTEGRATING TABLET    4mg  ODT q4 hours prn nausea/vomit   ONDANSETRON (ZOFRAN ODT) 4 MG DISINTEGRATING TABLET    4mg  ODT q4 hours prn nausea/vomit   OXYCODONE-ACETAMINOPHEN (PERCOCET/ROXICET) 5-325 MG TABLET    Take 1 tablet by mouth every 6 (six) hours as needed.   OXYCODONE-ACETAMINOPHEN (PERCOCET/ROXICET) 5-325 MG TABLET    Take 1 tablet by mouth every 6 (six) hours as needed.   TAMSULOSIN (FLOMAX) 0.4 MG CAPS CAPSULE    Take 1 capsule (0.4 mg total) by mouth daily.     Bethann Berkshire, MD 02/18/17 [phone]

## 2017-02-18 NOTE — ED Notes (Signed)
Pt states that the pain and nausea is starting to return, 

## 2017-02-18 NOTE — ED Notes (Signed)
Iv removed from right forearm, pt tolerated well

## 2017-02-18 NOTE — ED Notes (Signed)
ED Provider at bedside. 

## 2017-02-18 NOTE — Discharge Instructions (Signed)
Follow up with [facility] this week. °

## 2017-02-18 NOTE — ED Triage Notes (Signed)
Pt c/o left side flank and lower abd pain that started today with n/v

## 2017-02-19 MED FILL — Oxycodone w/ Acetaminophen Tab 5-325 MG: ORAL | Qty: 6 | Status: AC

## 2017-02-19 MED FILL — Ondansetron HCl Tab 4 MG: ORAL | Qty: 4 | Status: AC

## 2017-05-23 DIAGNOSIS — Z029 Encounter for administrative examinations, unspecified: Secondary | ICD-10-CM

## 2017-07-06 ENCOUNTER — Encounter: Payer: Self-pay | Admitting: Family Medicine

## 2017-07-06 ENCOUNTER — Ambulatory Visit: Payer: Commercial Managed Care - PPO | Admitting: Family Medicine

## 2017-07-06 VITALS — BP 132/82 | Ht 76.0 in | Wt 394.0 lb

## 2017-07-06 DIAGNOSIS — R202 Paresthesia of skin: Secondary | ICD-10-CM | POA: Diagnosis not present

## 2017-07-06 DIAGNOSIS — F5101 Primary insomnia: Secondary | ICD-10-CM | POA: Diagnosis not present

## 2017-07-06 DIAGNOSIS — F419 Anxiety disorder, unspecified: Secondary | ICD-10-CM

## 2017-07-06 DIAGNOSIS — Z23 Encounter for immunization: Secondary | ICD-10-CM

## 2017-07-06 DIAGNOSIS — G4733 Obstructive sleep apnea (adult) (pediatric): Secondary | ICD-10-CM

## 2017-07-06 MED ORDER — ESCITALOPRAM OXALATE 10 MG PO TABS: 10.0000 mg | ORAL_TABLET | Freq: Every day | ORAL | 5 refills | Status: DC

## 2017-07-06 MED ORDER — CLONAZEPAM 0.5 MG PO TABS: ORAL_TABLET | ORAL | 5 refills | Status: DC

## 2017-07-06 NOTE — Progress Notes (Signed)
   Subjective:    Patient ID: Aaron CoveyMatthew B Capwell, male    DOB: Mar 31, 1981, 36 y.o.   MRN: [id number] Patient arrives with numerous concerns HPI  Patient is here today to follow up on Anxiety. He is on Lexapro 10 mg daily. He says he still has bouts of anxiety,but it is some what better over all.   Notes the lexpro has helped, more of an even ed out feeling  Paraesthesia less often now, geernlly with sig stress only  Asthma clinically stable, breathing ok , no majo prob lwith this  CPAP uses faithfully. Notes helps a lot, helps day time faigu, helps signifintly  Uses clonazepam prn, usually aroundtwo or thrree in the afternoonn  Exercise stays active, hoping to work out more.diet still a challenge, often binge eating anf reating excessive snacks at times   Review of Systems No headache, no major weight loss or weight gain, no chest pain no back pain abdominal pain no change in bowel habits complete ROS otherwise negative     Objective:   Physical Exam   Alert and oriented, vitals reviewed and stable, NAD ENT-TM's and ext canals WNL bilat via otoscopic exam Soft palate, tonsils and post pharynx WNL via oropharyngeal exam Neck-symmetric, no masses; thyroid nonpalpable and nontender Pulmonary-no tachypnea or accessory muscle use; Clear without wheezes via auscultation Card--no abnrml murmurs, rhythm reg and rate WNL Carotid pulses symmetric, without bruits     Assessment & Plan:  Impression 1 obstructive sleep apnea discussed to maintain CPAP use proper use discussed #2 chronic generalized anxiety disorder currently in progress maintained  3.  Paresthesias clinically improved related to  4.  Morbid obesity discussed at great length if no substantial improvement official referral bariatric intervention  Greater than 50% of this 25 minute face to face visit was spent in counseling and discussion and coordination of care regarding the above diagnosis/diagnosies

## 2017-09-10 ENCOUNTER — Encounter: Payer: Self-pay | Admitting: Family Medicine

## 2017-09-10 ENCOUNTER — Ambulatory Visit: Payer: Commercial Managed Care - PPO | Admitting: Family Medicine

## 2017-09-10 VITALS — BP 138/88 | Temp 98.7°F | Ht 76.0 in | Wt 383.2 lb

## 2017-09-10 DIAGNOSIS — J329 Chronic sinusitis, unspecified: Secondary | ICD-10-CM

## 2017-09-10 DIAGNOSIS — J4521 Mild intermittent asthma with (acute) exacerbation: Secondary | ICD-10-CM

## 2017-09-10 DIAGNOSIS — J310 Chronic rhinitis: Secondary | ICD-10-CM

## 2017-09-10 MED ORDER — ALBUTEROL SULFATE HFA 108 (90 BASE) MCG/ACT IN AERS: 2.0000 | INHALATION_SPRAY | Freq: Four times a day (QID) | RESPIRATORY_TRACT | 2 refills | Status: DC | PRN

## 2017-09-10 MED ORDER — CLARITHROMYCIN 500 MG PO TABS: 500.0000 mg | ORAL_TABLET | Freq: Two times a day (BID) | ORAL | 0 refills | Status: DC

## 2017-09-10 NOTE — Progress Notes (Signed)
   Subjective:    Patient ID: Aaron Chambers, male    DOB: [birth year], 37 y.o.   MRN: [id number]  Sinusitis  The current episode started 1 to 4 weeks ago. The maximum temperature recorded prior to his arrival was 101 - 101.9 F. The fever has been present for 1 to 2 days. Associated symptoms include coughing, ear pain, headaches and a sore throat. (Wheezing, fever, runny nose ) Past treatments include acetaminophen (Tylenol Cold and Flu; wife brought home Tamiflu(coworker at wifes work gave Tamiflu); Delsyum). The treatment provided no relief.   Couple weeks symtoms started with fever and cough and cong and achisness  Felt bad ofr few days  Slowly got better  Then this weekend hit hard, bad cough , feer this wekend  tmax 102  Fever hi early on    Review of Systems  HENT: Positive for ear pain and sore throat.   Respiratory: Positive for cough.   Neurological: Positive for headaches.       Objective:   Physical Exam  Alert active good hydration moderate malaise.  Positive nasal discharge pharynx okay lungs expiratory wheezes bronchial cough no crackles no tachypnea heart regular rate and rhythm.      Assessment & Plan:  Impression post flu rhinosinusitis/bronchitis with element of reactive airways discussed Biaxin twice daily 10 days albuterol 2 sprays 4 times daily symptom care discussed

## 2017-12-31 ENCOUNTER — Other Ambulatory Visit: Payer: Self-pay | Admitting: Family Medicine

## 2017-12-31 NOTE — Telephone Encounter (Signed)
Prescriptions called into [facility]. Patient notified.

## 2017-12-31 NOTE — Telephone Encounter (Signed)
Ok one mo each

## 2017-12-31 NOTE — Telephone Encounter (Signed)
Last med check up 07/06/17

## 2017-12-31 NOTE — Telephone Encounter (Signed)
Pt has CPE scheduled for 01/14/18. He is hoping to get refills on escitalopram (LEXAPRO) 10 MG tablet and clonazePAM (KLONOPIN) 0.5 MG tablet until the appt. Please send to [facility] - [place], [place] - [address]

## 2018-01-04 ENCOUNTER — Encounter: Payer: Commercial Managed Care - PPO | Admitting: Family Medicine

## 2018-01-07 ENCOUNTER — Telehealth: Payer: Self-pay | Admitting: Family Medicine

## 2018-01-07 ENCOUNTER — Ambulatory Visit (INDEPENDENT_AMBULATORY_CARE_PROVIDER_SITE_OTHER): Payer: Managed Care, Other (non HMO) | Admitting: Family Medicine

## 2018-01-07 ENCOUNTER — Encounter: Payer: Self-pay | Admitting: Family Medicine

## 2018-01-07 VITALS — BP 118/84 | Ht 76.0 in | Wt 376.0 lb

## 2018-01-07 DIAGNOSIS — F322 Major depressive disorder, single episode, severe without psychotic features: Secondary | ICD-10-CM | POA: Diagnosis not present

## 2018-01-07 DIAGNOSIS — F329 Major depressive disorder, single episode, unspecified: Secondary | ICD-10-CM | POA: Diagnosis not present

## 2018-01-07 DIAGNOSIS — F32A Depression, unspecified: Secondary | ICD-10-CM

## 2018-01-07 NOTE — Progress Notes (Signed)
   Subjective:    Patient ID: Aaron Chambers, male    DOB: [birth year], 37 y.o.   MRN: [id number]  HPIPt having some depression for the past  2-3 weeks. Pt thinks it might be one of his meds.  Patient relates over the past few weeks since he has been on Ameridex and he has been experiencing depressionClomid along with increased anxiety he has been on his chronic medication states nothing else is changed he relates a lot of anxiousness worry difficult time focusing also relates a fair amount of feeling sad blue down feeling like he is a failure plus also has had some thoughts of suicidal but he states he does not have a specific plan  He has shared this with his wife who hid his firearm Patient denies being suicidal at this time feels he is under reasonable control He has never had this before he states he has not been depressed in the past just anxious and nervous I did discuss with him in detail potential treatments    Office Visit from 01/07/2018 in [facility]  PHQ-9 Total Score  15      Review of Systems Patient denies chest tightness pressure pain shortness breath vomiting diarrhea fever chills sweats denies headaches    Objective:   Physical Exam  25 minutes was spent with the patient.  This statement verifies that 25 minutes was indeed spent with the patient.  More than 50% of this visit-total duration of the visit-was spent in counseling and coordination of care. The issues that the patient came in for today as reflected in the diagnosis (s) please refer to documentation for further details.  Patient is calm and able to interact appropriately does not appear to be in any distress     Assessment & Plan:  Severe depression Severe anxiety issues Double up on Lexapro Continue Klonopin Referral to psychiatry and counselor urgent Will provide family contact them to call In addition to this patient denies being suicidal currently and he makes a verbal pack that he  will not hurt himself and if he does feel like hurting himself he will immediately reach out for help either with his therapist ER or us  Patient also agrees and so does his wife that they will move his firearms from his house and keep the ammunition out of them along with having trigger locks  Long discussion held with the patient regarding depression anxiety the importance of taking medicine and the importance of him to reach out should he gets worse plus also discussion held regarding treatment including psychiatry consultation and psychology counseling patient can follow-up immediately if any problems I believe that his fertility medicine is potentially playing a role in this since it did start up after he started on these medicines.  It should be noted that I did a thorough research of side effects and there is a low likelihood but does not likelihood that Arimidex and Clomid could cause increased depression symptoms therefore I recommended to the patient that he stop these and discuss it further with his urologist  More than likely it will take several weeks together with psychiatry therefore I recommend that the patient be seen next week specific for this issue he has a physical coming up in the near future but I really do not advise that the physical be the same visit as follow-up on his depression

## 2018-01-07 NOTE — Telephone Encounter (Signed)
ERROR

## 2018-01-07 NOTE — Telephone Encounter (Signed)
Pt contacted office to state that for the past 2 weeks he has been really depressed. Pt has really bad anxiety. Pt is on antidepressants. Pt states they do help. Pt states he and his wife are trying to get pregnant. Pt states he went to a urologist and had labs done. Testosterone is low and pt was put on med to boost testosterone and lower estrogen. Pt was put on Blominphine citrate 50mg  once a day and Anastrozaole 1mg  once a day. Patient states he sex drives is low, he does not even want sex. Pt stated that he has had thoughts of going to get his gun and shooting himself. Pt states that he does not believe he would actually do it, but it has been a thought. Pt states this gets worse in the evening. Pt is in a safe environment at this time. Spoke with Dr.Scott due to PCP being out of office. Dr.Scott recommended [facility] to [facility] [place] for emergent evaluation. Pt stated that he didn't think it was that bad to go to [facility]. Dr.Scott offered to see patient at 5:00 today. Pt state that would be fine and has appt today Pt was asked to bring a family member with him to appt; pt states he doesn't think it that's bad but agreed to bring wife.

## 2018-01-08 ENCOUNTER — Other Ambulatory Visit: Payer: Self-pay | Admitting: Family Medicine

## 2018-01-08 ENCOUNTER — Telehealth: Payer: Self-pay | Admitting: Family Medicine

## 2018-01-08 MED ORDER — ESCITALOPRAM OXALATE 20 MG PO TABS: 20.0000 mg | ORAL_TABLET | Freq: Every day | ORAL | 5 refills | Status: DC

## 2018-01-08 NOTE — Telephone Encounter (Signed)
Pt notified and verbalized understanding.

## 2018-01-08 NOTE — Telephone Encounter (Signed)
Pt seen yesterday for depression. Pt contacted office and asked had his Lexapro been sent in because the pharmacy did not have it. Please advise. Thanks

## 2018-01-08 NOTE — Telephone Encounter (Signed)
Please tell pt it was just sent in Thanks

## 2018-01-11 ENCOUNTER — Telehealth: Payer: Self-pay | Admitting: Family Medicine

## 2018-01-11 NOTE — Telephone Encounter (Signed)
Spoke with Brendale to get numbers. Enid DerryBrendale looked at pts insurance card and pt needs to call the customer service number on back of insurance card to get a list of providers in area and then call them. Pt is to then call us with information on if he was able to set up an appt and with whom. Contacted patient and pt verbalized understanding. Pt stated that since he was taken off those meds he has felt better and better every day.

## 2018-01-11 NOTE — Telephone Encounter (Signed)
The other day when this patient was in he was having some suicidal ideation but he felt that he was not a current threat to hurt himself but because of the serious nature of this please talk with Enid DerryBrendale find out the name of a mental health facility will-or facilities-that could see him for counseling and evaluation and call the patient and give him the phone number so he can call and set up the appointments.  He has a follow-up appointment coming up with Dr. Brett CanalesSteve regarding all of this. The patient cell phone number [phone]  Please document

## 2018-01-11 NOTE — Telephone Encounter (Signed)
Glad to hear this, he will follow up with Brett CanalesSteve next week as planned

## 2018-01-14 ENCOUNTER — Encounter: Payer: Commercial Managed Care - PPO | Admitting: Family Medicine

## 2018-01-15 ENCOUNTER — Ambulatory Visit: Payer: Managed Care, Other (non HMO) | Admitting: Family Medicine

## 2018-01-18 ENCOUNTER — Ambulatory Visit (INDEPENDENT_AMBULATORY_CARE_PROVIDER_SITE_OTHER): Payer: Managed Care, Other (non HMO) | Admitting: Family Medicine

## 2018-01-18 ENCOUNTER — Encounter: Payer: Self-pay | Admitting: Family Medicine

## 2018-01-18 VITALS — BP 130/88 | Ht 76.0 in | Wt 381.0 lb

## 2018-01-18 DIAGNOSIS — F419 Anxiety disorder, unspecified: Secondary | ICD-10-CM | POA: Diagnosis not present

## 2018-01-18 DIAGNOSIS — F329 Major depressive disorder, single episode, unspecified: Secondary | ICD-10-CM

## 2018-01-18 DIAGNOSIS — F32A Depression, unspecified: Secondary | ICD-10-CM

## 2018-01-18 NOTE — Progress Notes (Signed)
   Subjective:    Patient ID: Aaron Chambers, male    DOB: 1980-12-16, 37 y.o.   MRN: [id number]  Depression         The current episode started 1 to 4 weeks ago.  Pt is here for depression follow up per Dr.Scott. Pt was seen on 01/07/2018. Pt states he is doing a lot better since stopping the Arimidex and Clomid.   Pt notes substanitally worse with depr and anxiety with the meds  Now that has stopped the rmidex and clomid, pt notes much improvement  Pt si        Review of Systems  Psychiatric/Behavioral: Positive for depression.       Objective:   Physical Exam  Alert vitals stable, NAD. Blood pressure good on repeat. HEENT normal. Lungs clear. Heart regular rate and rhythm.       Assessment & Plan:  1 impression depression and anxiety, clinically improved.  Better off the hormone supplement.  See prior notes.  No longer thoughts of self-harm.  Seeking out mental health management through his workplace which is free.  To maintain same dose of Lexapro follow-up wellness  Greater than 50% of this 15 minute face to face visit was spent in counseling and discussion and coordination of care regarding the above diagnosis/diagnosies

## 2018-01-30 ENCOUNTER — Encounter: Payer: Self-pay | Admitting: Family Medicine

## 2018-01-30 ENCOUNTER — Other Ambulatory Visit: Payer: Self-pay | Admitting: Family Medicine

## 2018-01-30 ENCOUNTER — Ambulatory Visit (INDEPENDENT_AMBULATORY_CARE_PROVIDER_SITE_OTHER): Payer: Managed Care, Other (non HMO) | Admitting: Family Medicine

## 2018-01-30 VITALS — BP 128/92 | Ht 76.0 in | Wt 385.0 lb

## 2018-01-30 DIAGNOSIS — Z1329 Encounter for screening for other suspected endocrine disorder: Secondary | ICD-10-CM | POA: Diagnosis not present

## 2018-01-30 DIAGNOSIS — Z79899 Other long term (current) drug therapy: Secondary | ICD-10-CM

## 2018-01-30 DIAGNOSIS — Z0000 Encounter for general adult medical examination without abnormal findings: Secondary | ICD-10-CM

## 2018-01-30 DIAGNOSIS — F322 Major depressive disorder, single episode, severe without psychotic features: Secondary | ICD-10-CM

## 2018-01-30 DIAGNOSIS — G4733 Obstructive sleep apnea (adult) (pediatric): Secondary | ICD-10-CM

## 2018-01-30 DIAGNOSIS — F5101 Primary insomnia: Secondary | ICD-10-CM | POA: Diagnosis not present

## 2018-01-30 MED ORDER — CLONAZEPAM 0.5 MG PO TABS: ORAL_TABLET | ORAL | 5 refills | Status: DC

## 2018-01-30 MED ORDER — ESCITALOPRAM OXALATE 20 MG PO TABS: 20.0000 mg | ORAL_TABLET | Freq: Every day | ORAL | 5 refills | Status: DC

## 2018-01-30 NOTE — Progress Notes (Signed)
Subjective:    Patient ID: Aaron Chambers, male    DOB: March 22, 1981, 37 y.o.   MRN: [id number]  HPI The patient comes in today for a wellness visit.    A review of their health history was completed.  A review of medications was also completed.  Any needed refills; Klonopin  Eating habits: tries to be health conscious  Falls/  MVA accidents in past few months: no  Regular exercise: walks daily  Specialist pt sees on regular basis: urologist (seeing due to wife trying to get pregnant)  Preventative health issues were discussed.   Additional concerns: constipation; sometimes is harder than others; been going on for a while.  Results for orders placed or performed during the hospital encounter of 02/18/17  Urinalysis, Routine w reflex microscopic  Result Value Ref Range   Color, Urine YELLOW YELLOW   APPearance HAZY (A) CLEAR   Specific Gravity, Urine 1.025 1.005 - 1.030   pH 5.0 5.0 - 8.0   Glucose, UA NEGATIVE NEGATIVE mg/dL   Hgb urine dipstick LARGE (A) NEGATIVE   Bilirubin Urine NEGATIVE NEGATIVE   Ketones, ur 5 (A) NEGATIVE mg/dL   Protein, ur 30 (A) NEGATIVE mg/dL   Nitrite NEGATIVE NEGATIVE   Leukocytes, UA NEGATIVE NEGATIVE   RBC / HPF TOO NUMEROUS TO COUNT 0 - 5 RBC/hpf   WBC, UA 0-5 0 - 5 WBC/hpf   Bacteria, UA RARE (A) NONE SEEN   Squamous Epithelial / LPF 0-5 (A) NONE SEEN   Mucus PRESENT   CBC with Differential/Platelet  Result Value Ref Range   WBC 13.5 (H) 4.0 - 10.5 K/uL   RBC 5.05 4.22 - 5.81 MIL/uL   Hemoglobin 14.2 13.0 - 17.0 g/dL   HCT 40.943.2 81.139.0 - 91.452.0 %   MCV 85.5 78.0 - 100.0 fL   MCH 28.1 26.0 - 34.0 pg   MCHC 32.9 30.0 - 36.0 g/dL   RDW 78.214.0 95.611.5 - 21.315.5 %   Platelets 201 150 - 400 K/uL   Neutrophils Relative % 87 %   Neutro Abs 11.7 (H) 1.7 - 7.7 K/uL   Lymphocytes Relative 10 %   Lymphs Abs 1.4 0.7 - 4.0 K/uL   Monocytes Relative 3 %   Monocytes Absolute 0.5 0.1 - 1.0 K/uL   Eosinophils Relative 0 %   Eosinophils Absolute 0.0  0.0 - 0.7 K/uL   Basophils Relative 0 %   Basophils Absolute 0.0 0.0 - 0.1 K/uL  Comprehensive metabolic panel  Result Value Ref Range   Sodium 144 135 - 145 mmol/L   Potassium 4.5 3.5 - 5.1 mmol/L   Chloride 111 101 - 111 mmol/L   CO2 23 22 - 32 mmol/L   Glucose, Bld 143 (H) 65 - 99 mg/dL   BUN 17 6 - 20 mg/dL   Creatinine, Ser 0.861.03 0.61 - 1.24 mg/dL   Calcium 9.8 8.9 - 57.810.3 mg/dL   Total Protein 8.0 6.5 - 8.1 g/dL   Albumin 4.6 3.5 - 5.0 g/dL   AST 47 (H) 15 - 41 U/L   ALT 70 (H) 17 - 63 U/L   Alkaline Phosphatase 90 38 - 126 U/L   Total Bilirubin 1.0 0.3 - 1.2 mg/dL   GFR calc non Af Amer >60 >60 mL/min   GFR calc Af Amer >60 >60 mL/min   Anion gap 10 5 - 15    Exercising thirty to forty minutes most days, staying pysically active  Overall relatively good diet  Uses clonazepam prn anxiety  Has used occas meds for constipation, liquid yuses prn m o m,  Pt rtried [place], probiotics prn      Review of Systems  Constitutional: Negative for activity change, appetite change and fever.  HENT: Negative for congestion and rhinorrhea.   Eyes: Negative for discharge.  Respiratory: Negative for cough and wheezing.   Cardiovascular: Negative for chest pain.  Gastrointestinal: Negative for abdominal pain, blood in stool and vomiting.  Genitourinary: Negative for difficulty urinating and frequency.  Musculoskeletal: Negative for neck pain.  Skin: Negative for rash.  Allergic/Immunologic: Negative for environmental allergies and food allergies.  Neurological: Negative for weakness and headaches.  Psychiatric/Behavioral: Negative for agitation.  All other systems reviewed and are negative.      Objective:   Physical Exam  Constitutional: He appears well-developed and well-nourished.  Morbid obesity present  HENT:  Head: Normocephalic and atraumatic.  Right Ear: External ear normal.  Left Ear: External ear normal.  Nose: Nose normal.  Mouth/Throat: Oropharynx is  clear and moist.  Eyes: Pupils are equal, round, and reactive to light. EOM are normal.  Neck: Normal range of motion. Neck supple. No thyromegaly present.  Cardiovascular: Normal rate, regular rhythm and normal heart sounds.  No murmur heard. Pulmonary/Chest: Effort normal and breath sounds normal. No respiratory distress. He has no wheezes.  Abdominal: Soft. Bowel sounds are normal. He exhibits no distension and no mass. There is no tenderness.  Genitourinary: Penis normal.  Musculoskeletal: Normal range of motion. He exhibits no edema.  Lymphadenopathy:    He has no cervical adenopathy.  Neurological: He is alert. He exhibits normal muscle tone.  Skin: Skin is warm and dry. No erythema.  Psychiatric: He has a normal mood and affect. His behavior is normal. Judgment normal.          Assessment & Plan:  #1 impression wellness exam.  Diet discussed.  Exercise discussed.  Blood work discussed.  2.  Elevated blood pressure similar on repeat  3.  Morbid obesity discussed at great length.  Patient has continued to gain weight despite her ongoing attempts to work with this  4.  Obstructive sleep apnea.  States overall stable using CPAP  5.  Insomnia ongoing  6.  Element of depression generalized anxiety disorder stable  Bariatric referral rationale discussed.  Blood work discussed medications refilled diet exercise discussed and are encouraged follow-up in 6 months

## 2018-02-06 ENCOUNTER — Encounter (INDEPENDENT_AMBULATORY_CARE_PROVIDER_SITE_OTHER): Payer: Self-pay

## 2018-02-06 ENCOUNTER — Encounter: Payer: Self-pay | Admitting: Family Medicine

## 2018-03-13 NOTE — Progress Notes (Signed)
Psychiatric Initial Adult Assessment   Patient Identification: Aaron Chambers MRN:  [id number] Date of Evaluation:  03/18/2018 Referral Source: Dr. Ardyth Gal Chief Complaint:   Chief Complaint    Psychiatric Evaluation; Anxiety    "I'm not depressed person." Visit Diagnosis:    ICD-10-CM   1. Anxiety disorder, unspecified type F41.9     History of Present Illness:   Aaron Chambers is a 37 y.o. year old male with a history of anxiety disorder, hypertension, OSA, who is referred for anxiety.   Patient states that he was advised by his PCP to come here.  He had an episode of depression, being withdrawal 1.5 months ago, although he is "never depressed person." He was in a bed, holding a gun without bullet. Although he has been "happily married" for 9 years, he talked about divorce with his wife. 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 He was advised to take half of these medication and lexapro was uptitrated. He denies any depression since then, and denies any SI. Although he may mildly feel anxious at times, he believes it is his baseline. He will have another appointment to check sperm count tomorrow. He may consider give up treatment, stating that he is fine to live with his wife. He and his wife has been discussing about the treatment, and denies any discordance in their view. He tends to feel anxious when he is around his father, who is always "negative," although he states that his father is a "geat man."  He may occasionally feels more anxious when there are group of people as he cannot focus well, although he loves to talk with them. He denies other concern. He would like to bring his wife to the appointment as she knows him "better than myself."  He denies insomnia. He denies feeling depressed.  He denies anhedonia.  He denies SI. He has decreased concentration at times. He denies irritability.  He denies panic attacks.  He  may take clonazepam a few days per week for some anxiety. He drinks a few times per month. He uses once every six month for marijuana for anxiety.   Wt Readings from Last 3 Encounters:  03/18/18 (!) 388 lb (176 kg)  01/30/18 (!) 385 lb (174.6 kg)  01/18/18 (!) 381 lb (172.8 kg)    Per PMP,  Clonazepam last filled on 02/07/2018   Associated Signs/Symptoms: Depression Symptoms:  denies (Hypo) Manic Symptoms:  denies decreased need for sleep, euphoria Anxiety Symptoms:  mild anxiety Psychotic Symptoms:  denies AH, VH PTSD Symptoms: Negative  Past Psychiatric History:  Outpatient: anxiety, denies seeing [facility] Psychiatry admission: denies  Previous suicide attempt: denies  Past trials of medication: lexapro History of violence: denies  Previous Psychotropic Medications: Yes   Substance Abuse History in the last 12 months:  No.  Consequences of Substance Abuse: NA  Past Medical History:  Past Medical History:  Diagnosis Date  . Allergic rhinitis   . Anxiety   . Arrhythmia   . Asthma   . Kidney stones   . Obesity     Past Surgical History:  Procedure Laterality Date  . CARDIAC ELECTROPHYSIOLOGY STUDY AND ABLATION    . VASCULAR SURGERY      Family Psychiatric History:  Denies   Family History: No family history on file.  Social History:   Social History   Socioeconomic History  . Marital status: Married    Spouse name: Not on file  .  Number of children: Not on file  . Years of education: Not on file  . Highest education level: Not on file  Occupational History  . Not on file  Social Needs  . Financial resource strain: Not on file  . Food insecurity:    Worry: Not on file    Inability: Not on file  . Transportation needs:    Medical: Not on file    Non-medical: Not on file  Tobacco Use  . Smoking status: Never Smoker  . Smokeless tobacco: Former Engineer, water and Sexual Activity  . Alcohol use: No  . Drug use: No  . Sexual activity: Not on file   Lifestyle  . Physical activity:    Days per week: Not on file    Minutes per session: Not on file  . Stress: Not on file  Relationships  . Social connections:    Talks on phone: Not on file    Gets together: Not on file    Attends religious service: Not on file    Active member of club or organization: Not on file    Attends meetings of clubs or organizations: Not on file    Relationship status: Not on file  Other Topics Concern  . Not on file  Social History Narrative  . Not on file    Additional Social History:  Married for 9 years, no children.  He grew up in [place]. He reports good relationship with his parents. His father tends to be isolative, "negative." His mother is outgoing.  Education: graduated from college, Hospital doctor,  Work: Catering manager for [employer], for 5 years   Allergies:  No Known Allergies  Metabolic Disorder Labs: No results found for: HGBA1C, MPG No results found for: PROLACTIN Lab Results  Component Value Date   CHOL 169 06/09/2016   TRIG 81 06/09/2016   HDL 45 06/09/2016   CHOLHDL 3.8 06/09/2016   VLDL 25 03/23/2014   LDLCALC 108 (H) 06/09/2016   LDLCALC 82 04/29/2015     Current Medications: Current Outpatient Medications  Medication Sig Dispense Refill  . albuterol (PROVENTIL HFA;VENTOLIN HFA) 108 (90 Base) MCG/ACT inhaler Inhale 2 puffs into the lungs every 6 (six) hours as needed for wheezing or shortness of breath. 1 Inhaler 2  . anastrozole (ARIMIDEX) 1 MG tablet Take by mouth.    . clomiPHENE (CLOMID) 50 MG tablet     . clonazePAM (KLONOPIN) 0.5 MG tablet TAKE (1) TABLET BY MOUTH UP TO BID PRN 30 tablet 5  . escitalopram (LEXAPRO) 20 MG tablet Take 1 tablet (20 mg total) by mouth daily. 30 tablet 5  . Multiple Vitamin (MULTIVITAMIN WITH MINERALS) TABS tablet Take 1 tablet by mouth daily.     No current facility-administered medications for this visit.     Neurologic: Headache: No Seizure:  No Paresthesias:No  Musculoskeletal: Strength & Muscle Tone: within normal limits Gait & Station: normal Patient leans: N/A  Psychiatric Specialty Exam: Review of Systems  Psychiatric/Behavioral: Negative for depression, hallucinations, memory loss, substance abuse and suicidal ideas. The patient is nervous/anxious. The patient does not have insomnia.   All other systems reviewed and are negative.   Blood pressure 125/84, pulse 62, height 6\' 4"  (1.93 m), weight (!) 388 lb (176 kg), SpO2 96 %.Body mass index is 47.23 kg/m.  General Appearance: Fairly Groomed  Eye Contact:  Good  Speech:  Clear and Coherent  Volume:  Normal  Mood:  Anxious  Affect:  Appropriate, Congruent and reactive, euthymic  Thought Process:  Coherent  Orientation:  Full (Time, Place, and Person)  Thought Content:  Logical  Suicidal Thoughts:  No  Homicidal Thoughts:  No  Memory:  Immediate;   Good  Judgement:  Good  Insight:  Fair  Psychomotor Activity:  Normal  Concentration:  Concentration: Good and Attention Span: Good  Recall:  Good  Fund of Knowledge:Good  Language: Good  Akathisia:  No  Handed:  Right  AIMS (if indicated):  N/A  Assets:  Communication Skills Desire for Improvement  ADL's:  Intact  Cognition: WNL  Sleep:  good   TSH wnl, on 11/2017  Assessment Aaron Chambers is a 37 y.o. year old male with a history of anxiety disorder, hypertension, OSA, who is referred for anxiety.  # Unspecified anxiety disorder Exam is notable for preserved affect reactivity, and patient reports mild anxiety, but denies significant neurovegetative symptoms.  His clinical course is more consistent with medication (clomid) induced depressed mood with SI. He is advised to continue lexapro at this time to target anxiety. He is advised to have continued discussion with his REI regarding his treatment/potential adverse reaction.   Plan 1. Continue lexapro 20 mg daily  2. Return to clinic in one month for  30 mins- he is willing to bring his wife to the appointment to elaborate more about his mental status.  The patient demonstrates the following risk factors for suicide: Chronic risk factors for suicide include: psychiatric disorder of anxiety. Acute risk factors for suicide include: N/A. Protective factors for this patient include: positive social support, responsibility to others (children, family), coping skills and hope for the future. Considering these factors, the overall suicide risk at this point appears to be low. Patient is appropriate for outpatient follow up. Although he does have access to guns, those are locked. He also asked his wife to monitor him given his recent episode.   Treatment Plan Summary: Plan as above   Neysa Hotter, MD 9/9/201912:00 PM

## 2018-03-18 ENCOUNTER — Ambulatory Visit (INDEPENDENT_AMBULATORY_CARE_PROVIDER_SITE_OTHER): Payer: Managed Care, Other (non HMO) | Admitting: Psychiatry

## 2018-03-18 ENCOUNTER — Encounter (HOSPITAL_COMMUNITY): Payer: Self-pay | Admitting: Psychiatry

## 2018-03-18 VITALS — BP 125/84 | HR 62 | Ht 76.0 in | Wt 388.0 lb

## 2018-03-18 DIAGNOSIS — F419 Anxiety disorder, unspecified: Secondary | ICD-10-CM

## 2018-03-18 NOTE — Patient Instructions (Signed)
1. Continue lexapro 20 mg daily  2. Return to clinic in one month for 30 mins

## 2018-03-24 ENCOUNTER — Encounter (HOSPITAL_COMMUNITY): Payer: Self-pay | Admitting: *Deleted

## 2018-03-24 ENCOUNTER — Observation Stay (HOSPITAL_COMMUNITY)
Admission: EM | Admit: 2018-03-24 | Discharge: 2018-03-26 | DRG: 176 | Disposition: A | Discharge status: Home / Self Care | Payer: Managed Care, Other (non HMO) | Attending: Internal Medicine | Admitting: Internal Medicine

## 2018-03-24 ENCOUNTER — Other Ambulatory Visit: Payer: Self-pay

## 2018-03-24 ENCOUNTER — Emergency Department (HOSPITAL_COMMUNITY): Payer: Managed Care, Other (non HMO)

## 2018-03-24 DIAGNOSIS — Z87891 Personal history of nicotine dependence: Secondary | ICD-10-CM

## 2018-03-24 DIAGNOSIS — N469 Male infertility, unspecified: Secondary | ICD-10-CM | POA: Diagnosis present

## 2018-03-24 DIAGNOSIS — I82432 Acute embolism and thrombosis of left popliteal vein: Secondary | ICD-10-CM | POA: Diagnosis not present

## 2018-03-24 DIAGNOSIS — F419 Anxiety disorder, unspecified: Secondary | ICD-10-CM | POA: Diagnosis not present

## 2018-03-24 DIAGNOSIS — J45909 Unspecified asthma, uncomplicated: Secondary | ICD-10-CM | POA: Diagnosis present

## 2018-03-24 DIAGNOSIS — Z79811 Long term (current) use of aromatase inhibitors: Secondary | ICD-10-CM | POA: Diagnosis not present

## 2018-03-24 DIAGNOSIS — Z6841 Body Mass Index (BMI) 40.0 and over, adult: Secondary | ICD-10-CM | POA: Diagnosis not present

## 2018-03-24 DIAGNOSIS — Z79899 Other long term (current) drug therapy: Secondary | ICD-10-CM

## 2018-03-24 DIAGNOSIS — D6959 Other secondary thrombocytopenia: Secondary | ICD-10-CM | POA: Diagnosis present

## 2018-03-24 DIAGNOSIS — I2699 Other pulmonary embolism without acute cor pulmonale: Principal | ICD-10-CM | POA: Diagnosis present

## 2018-03-24 DIAGNOSIS — D696 Thrombocytopenia, unspecified: Secondary | ICD-10-CM

## 2018-03-24 LAB — I-STAT CHEM 8, ED
BUN: 16 mg/dL (ref 6–20)
CREATININE: 0.9 mg/dL (ref 0.61–1.24)
Calcium, Ion: 1.17 mmol/L (ref 1.15–1.40)
Chloride: 105 mmol/L (ref 98–111)
GLUCOSE: 109 mg/dL — AB (ref 70–99)
HCT: 40 % (ref 39.0–52.0)
HEMOGLOBIN: 13.6 g/dL (ref 13.0–17.0)
Potassium: 4.1 mmol/L (ref 3.5–5.1)
Sodium: 141 mmol/L (ref 135–145)
TCO2: 24 mmol/L (ref 22–32)

## 2018-03-24 MED ORDER — IOPAMIDOL (ISOVUE-370) INJECTION 76%
100.0000 mL | Freq: Once | INTRAVENOUS | Status: AC | PRN
  Administered 2018-03-24: 100 mL via INTRAVENOUS

## 2018-03-24 NOTE — ED Provider Notes (Addendum)
Emergency Department Provider Note   I have reviewed the triage vital signs and the nursing notes.   HISTORY  Chief Complaint Leg Pain   HPI Aaron Chambers is a 37 y.o. male with a history of superficial thrombophlebitis the presents to the emergency department today secondary to left leg swelling and pain is progressively worsened over the last 2 weeks.  Also states that this week he started having chest pain and shortness of breath with dry cough.  Mostly gone but he still has dyspnea and pain on exertion.  States he never had a DVT in the past but has had his veins stripped before.  States that the pain started out in the back of his left calf did not progressively worse and is now wrapping around.  The swelling really just started last 24 to 48 hours.  He had a couple trips recently but the symptoms started before that.  No fevers, rash or other associated symptoms. No other associated or modifying symptoms.    Past Medical History:  Diagnosis Date  . Allergic rhinitis   . Anxiety   . Arrhythmia   . Asthma   . Kidney stones   . Obesity     Patient Active Problem List   Diagnosis Date Noted  . Anxiety disorder 11/03/2015  . Morbid obesity (HCC) 03/23/2014  . Obstructive sleep apnea 10/04/2013  . Kidney stones 11/22/2012  . Insomnia 11/22/2012  . Asthma, chronic 11/22/2012    Past Surgical History:  Procedure Laterality Date  . CARDIAC ELECTROPHYSIOLOGY STUDY AND ABLATION    . VASCULAR SURGERY      Current Outpatient Rx  . Order #: [id number] Class: Normal  . Order #: [id number] Class: Historical Med  . Order #: [id number] Class: Historical Med  . Order #: [id number] Class: Print  . Order #: [id number] Class: Normal  . Order #: [id number] Class: Historical Med    Allergies Patient has no known allergies.  History reviewed. No pertinent family history.  Social History Social History   Tobacco Use  . Smoking status: Never Smoker  . Smokeless tobacco: Former  Engineer, water Use Topics  . Alcohol use: No  . Drug use: No    Review of Systems  All other systems negative except as documented in the HPI. All pertinent positives and negatives as reviewed in the HPI. ____________________________________________   PHYSICAL EXAM:  VITAL SIGNS: ED Triage Vitals  Enc Vitals Group     BP 03/24/18 2126 (!) 159/85     Pulse Rate 03/24/18 2126 92     Resp 03/24/18 2126 18     Temp 03/24/18 2126 98.9 F (37.2 C)     Temp Source 03/24/18 2126 Temporal     SpO2 03/24/18 2126 95 %     Weight 03/24/18 2125 (!) 375 lb (170.1 kg)     Height 03/24/18 2125 6' (1.829 m)    Constitutional: Alert and oriented. Well appearing and in no acute distress. Eyes: Conjunctivae are normal. PERRL. EOMI. Head: Atraumatic. Nose: No congestion/rhinnorhea. Mouth/Throat: Mucous membranes are moist.  Oropharynx non-erythematous. Neck: No stridor.  No meningeal signs.   Cardiovascular: Normal rate, regular rhythm. Good peripheral circulation. Grossly normal heart sounds.   Respiratory: Normal respiratory effort.  No retractions. Lungs CTAB. Gastrointestinal: Soft and nontender. No distention.  Musculoskeletal: Left calf ttp and ankle edema, no rash. No gross deformities of extremities. Neurologic:  Normal speech and language. No gross focal neurologic deficits are appreciated.  Skin:  Skin is warm, dry and intact.  No rash noted.  ____________________________________________   LABS (all labs ordered are listed, but only abnormal results are displayed)  Labs Reviewed  I-STAT CHEM 8, ED - Abnormal; Notable for the following components:      Result Value   Glucose, Bld 109 (*)    All other components within normal limits  ANTITHROMBIN III  PROTEIN C ACTIVITY  PROTEIN C, TOTAL  PROTEIN S ACTIVITY  PROTEIN S, TOTAL  LUPUS ANTICOAGULANT PANEL  BETA-2-GLYCOPROTEIN I ABS, IGG/M/A  HOMOCYSTEINE  FACTOR 5 LEIDEN  PROTHROMBIN GENE MUTATION  CARDIOLIPIN ANTIBODIES,  IGG, IGM, IGA  CBC WITH DIFFERENTIAL/PLATELET  COMPREHENSIVE METABOLIC PANEL  TROPONIN I  BRAIN NATRIURETIC PEPTIDE   ____________________________________________  EKG   EKG Interpretation  Date/Time:  Sunday March 24 2018 22:46:53 EDT Ventricular Rate:  72 PR Interval:    QRS Duration: 97 QT Interval:  390 QTC Calculation: 427 R Axis:   57 Text Interpretation:  Sinus rhythm Confirmed by Eber HongMiller, Brian ([id number]) on 03/27/2018 10:56:50 AM       ____________________________________________  RADIOLOGY  Ct Angio Chest Pe W And/or Wo Contrast  Result Date: 03/25/2018 CLINICAL DATA:  Left lower leg pain history of DVT EXAM: CT ANGIOGRAPHY CHEST WITH CONTRAST TECHNIQUE: Multidetector CT imaging of the chest was performed using the standard protocol during bolus administration of intravenous contrast. Multiplanar CT image reconstructions and MIPs were obtained to evaluate the vascular anatomy. CONTRAST:  100mL ISOVUE-370 IOPAMIDOL (ISOVUE-370) INJECTION 76% COMPARISON:  Chest x-ray 08/27/2014 FINDINGS: Cardiovascular: Suboptimal opacification of the pulmonary arteries to the segmental level. Filling defects right upper lobe, right lower lobe and right middle lobe segmental and subsegmental pulmonary vessels. Additional filling defect within left descending pulmonary artery and segmental and subsegmental left lower lobe and lingular branch vessels consistent with acute emboli. RV LV ratio is non elevated at 0.87. Nonaneurysmal aorta. Borderline cardiomegaly. No pericardial effusion. Mediastinum/Nodes: No enlarged mediastinal, hilar, or axillary lymph nodes. Thyroid gland, trachea, and esophagus demonstrate no significant findings. Lungs/Pleura: Lungs are clear. No pleural effusion or pneumothorax. 4 mm left upper lobe pulmonary nodule, series 6, image number 26. Upper Abdomen: No acute abnormality. Musculoskeletal: Degenerative changes of the spine. No acute or suspicious abnormality. Review of  the MIP images confirms the above findings. IMPRESSION: 1. Limited contrast opacification of pulmonary arterial system, despite this there are acute bilateral emboli as described above. No evidence for right heart strain. 2. No acute pulmonary infiltrate 3. 4 mm left upper lobe pulmonary nodule. No follow-up needed if patient is low-risk. Non-contrast chest CT can be considered in 12 months if patient is high-risk. This recommendation follows the consensus statement: Guidelines for Management of Incidental Pulmonary Nodules Detected on CT Images: From the Fleischner Society 2017; Radiology 2017; 284:228-243. Critical Value/emergent results were called by telephone at the time of interpretation on 03/25/2018 at 12:09 am to Dr. Marily MemosJASON Jonhatan Hearty , who verbally acknowledged these results. Electronically Signed   By: Jasmine PangKim  Fujinaga M.D.   On: 03/25/2018 00:09    ____________________________________________   PROCEDURES  Procedure(s) performed:   Procedures  CRITICAL CARE Performed by: Marily MemosJason Damyon Mullane Total critical care time: 35 minutes Critical care time was exclusive of separately billable procedures and treating other patients. Critical care was necessary to treat or prevent imminent or life-threatening deterioration. Critical care was time spent personally by me on the following activities: development of treatment plan with patient and/or surrogate as well as nursing, discussions with consultants, evaluation of patient's response to treatment, examination of patient, obtaining history  from patient or surrogate, ordering and performing treatments and interventions, ordering and review of laboratory studies, ordering and review of radiographic studies, pulse oximetry and re-evaluation of patient's condition.   Angiocath insertion Performed by: Marily Memos  Consent: Verbal consent obtained. Risks and benefits: risks, benefits and alternatives were discussed Time out: Immediately prior to procedure a  "time out" was called to verify the correct patient, procedure, equipment, support staff and site/side marked as required.  Preparation: Patient was prepped and draped in the usual sterile fashion.  Vein Location: R AC  Ultrasound Guided  Gauge: 20  Normal blood return and flush without difficulty Patient tolerance: Patient tolerated the procedure well with no immediate complications.  ____________________________________________   INITIAL IMPRESSION / ASSESSMENT AND PLAN / ED COURSE  Certainly it is quite possible patient has a left lower semi-DVT.  However with associated chest pain or shortness of breath that has not resolved even though his cough is resolved there is a concern for possible pulmonary embolus as well.  His lungs are clear making bronchitis less likely.  We will proceed with CT scan after his kidney function is verified.  If this is negative we will still give Lovenox and have him come back for an ultrasound tomorrow. If he does have a PE on his CT scan will disposition appropriately.  CT with PE. Coagulation panel ordered. Heparin started. LE DVT study ordered. Discussed with medicine. Floor admit.   Pertinent labs & imaging results that were available during my care of the patient were reviewed by me and considered in my medical decision making (see chart for details).  ____________________________________________  FINAL CLINICAL IMPRESSION(S) / ED DIAGNOSES  Final diagnoses:  Acute pulmonary embolism without acute cor pulmonale, unspecified pulmonary embolism type (HCC)     MEDICATIONS GIVEN DURING THIS VISIT:  Medications  heparin bolus via infusion 5,000 Units (has no administration in time range)    Followed by  heparin ADULT infusion 100 units/mL (25000 units/219mL sodium chloride 0.45%) (has no administration in time range)  iopamidol (ISOVUE-370) 76 % injection 100 mL (100 mLs Intravenous Contrast Given 03/24/18 2343)     NEW OUTPATIENT  MEDICATIONS STARTED DURING THIS VISIT:  New Prescriptions   No medications on file    Note:  This note was prepared with assistance of Dragon voice recognition software. Occasional wrong-word or sound-a-like substitutions may have occurred due to the inherent limitations of voice recognition software.   Challen Spainhour, Barbara Cower, MD 03/25/18 1610    Marily Memos, MD 04/01/18 [id number]

## 2018-03-24 NOTE — ED Triage Notes (Signed)
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

## 2018-03-25 ENCOUNTER — Inpatient Hospital Stay (HOSPITAL_COMMUNITY): Payer: Managed Care, Other (non HMO)

## 2018-03-25 ENCOUNTER — Observation Stay (HOSPITAL_COMMUNITY): Payer: Managed Care, Other (non HMO)

## 2018-03-25 ENCOUNTER — Other Ambulatory Visit: Payer: Self-pay

## 2018-03-25 DIAGNOSIS — I2699 Other pulmonary embolism without acute cor pulmonale: Secondary | ICD-10-CM | POA: Diagnosis not present

## 2018-03-25 DIAGNOSIS — D696 Thrombocytopenia, unspecified: Secondary | ICD-10-CM

## 2018-03-25 DIAGNOSIS — R0602 Shortness of breath: Secondary | ICD-10-CM | POA: Diagnosis not present

## 2018-03-25 LAB — COMPREHENSIVE METABOLIC PANEL
ALT: 42 U/L (ref 0–44)
AST: 32 U/L (ref 15–41)
Albumin: 4 g/dL (ref 3.5–5.0)
Alkaline Phosphatase: 56 U/L (ref 38–126)
Anion gap: 6 (ref 5–15)
BUN: 15 mg/dL (ref 6–20)
CHLORIDE: 106 mmol/L (ref 98–111)
CO2: 26 mmol/L (ref 22–32)
CREATININE: 0.85 mg/dL (ref 0.61–1.24)
Calcium: 9.2 mg/dL (ref 8.9–10.3)
Glucose, Bld: 114 mg/dL — ABNORMAL HIGH (ref 70–99)
Potassium: 4.1 mmol/L (ref 3.5–5.1)
Sodium: 138 mmol/L (ref 135–145)
Total Bilirubin: 0.9 mg/dL (ref 0.3–1.2)
Total Protein: 7.5 g/dL (ref 6.5–8.1)

## 2018-03-25 LAB — CBC WITH DIFFERENTIAL/PLATELET
Basophils Absolute: 0 10*3/uL (ref 0.0–0.1)
Basophils Relative: 0 %
EOS PCT: 3 %
Eosinophils Absolute: 0.3 10*3/uL (ref 0.0–0.7)
HCT: 41.7 % (ref 39.0–52.0)
Hemoglobin: 13.7 g/dL (ref 13.0–17.0)
LYMPHS ABS: 3.3 10*3/uL (ref 0.7–4.0)
LYMPHS PCT: 26 %
MCH: 28.1 pg (ref 26.0–34.0)
MCHC: 32.9 g/dL (ref 30.0–36.0)
MCV: 85.5 fL (ref 78.0–100.0)
MONOS PCT: 9 %
Monocytes Absolute: 1.2 10*3/uL — ABNORMAL HIGH (ref 0.1–1.0)
NEUTROS PCT: 62 %
Neutro Abs: 8.1 10*3/uL — ABNORMAL HIGH (ref 1.7–7.7)
PLATELETS: 138 10*3/uL — AB (ref 150–400)
RBC: 4.88 MIL/uL (ref 4.22–5.81)
RDW: 14 % (ref 11.5–15.5)
WBC: 13 10*3/uL — ABNORMAL HIGH (ref 4.0–10.5)

## 2018-03-25 LAB — BRAIN NATRIURETIC PEPTIDE: B NATRIURETIC PEPTIDE 5: 20 pg/mL (ref 0.0–100.0)

## 2018-03-25 LAB — HEPARIN LEVEL (UNFRACTIONATED)
HEPARIN UNFRACTIONATED: 0.17 [IU]/mL — AB (ref 0.30–0.70)
Heparin Unfractionated: 0.22 IU/mL — ABNORMAL LOW (ref 0.30–0.70)

## 2018-03-25 LAB — ANTITHROMBIN III: AntiThromb III Func: 80 % (ref 75–120)

## 2018-03-25 LAB — TROPONIN I

## 2018-03-25 LAB — ECHOCARDIOGRAM COMPLETE
Height: 76 in
Weight: 6139.2 oz

## 2018-03-25 LAB — PROTIME-INR
INR: 1.15
Prothrombin Time: 14.6 seconds (ref 11.4–15.2)

## 2018-03-25 LAB — APTT: aPTT: 29 seconds (ref 24–36)

## 2018-03-25 MED ORDER — ONDANSETRON HCL 4 MG PO TABS: 4.0000 mg | ORAL_TABLET | Freq: Four times a day (QID) | ORAL | Status: DC | PRN

## 2018-03-25 MED ORDER — ADULT MULTIVITAMIN W/MINERALS CH
1.0000 | ORAL_TABLET | Freq: Every day | ORAL | Status: DC
  Administered 2018-03-25 – 2018-03-26 (×2): 1 via ORAL
  Filled 2018-03-25 (×2): qty 1

## 2018-03-25 MED ORDER — ACETAMINOPHEN 650 MG RE SUPP: 650.0000 mg | Freq: Four times a day (QID) | RECTAL | Status: DC | PRN

## 2018-03-25 MED ORDER — ALBUTEROL SULFATE (2.5 MG/3ML) 0.083% IN NEBU: 3.0000 mL | INHALATION_SOLUTION | Freq: Four times a day (QID) | RESPIRATORY_TRACT | Status: DC | PRN

## 2018-03-25 MED ORDER — ACETAMINOPHEN 325 MG PO TABS
650.0000 mg | ORAL_TABLET | Freq: Four times a day (QID) | ORAL | Status: DC | PRN
  Administered 2018-03-25 (×2): 650 mg via ORAL
  Filled 2018-03-25 (×2): qty 2

## 2018-03-25 MED ORDER — ESCITALOPRAM OXALATE 10 MG PO TABS
20.0000 mg | ORAL_TABLET | Freq: Every day | ORAL | Status: DC
  Administered 2018-03-25 – 2018-03-26 (×2): 20 mg via ORAL
  Filled 2018-03-25 (×2): qty 2

## 2018-03-25 MED ORDER — ONDANSETRON HCL 4 MG/2ML IJ SOLN: 4.0000 mg | Freq: Four times a day (QID) | INTRAMUSCULAR | Status: DC | PRN

## 2018-03-25 MED ORDER — SODIUM CHLORIDE 0.9% FLUSH
3.0000 mL | Freq: Two times a day (BID) | INTRAVENOUS | Status: DC
  Administered 2018-03-26: 3 mL via INTRAVENOUS

## 2018-03-25 MED ORDER — CLONAZEPAM 0.5 MG PO TABS: 0.5000 mg | ORAL_TABLET | Freq: Two times a day (BID) | ORAL | Status: DC | PRN

## 2018-03-25 MED ORDER — HEPARIN BOLUS VIA INFUSION
3500.0000 [IU] | Freq: Once | INTRAVENOUS | Status: AC
  Administered 2018-03-25: 3500 [IU] via INTRAVENOUS
  Filled 2018-03-25: qty 3500

## 2018-03-25 MED ORDER — PERFLUTREN LIPID MICROSPHERE
1.0000 mL | INTRAVENOUS | Status: AC | PRN
  Administered 2018-03-25: 2 mL via INTRAVENOUS
  Administered 2018-03-25: 1 mL via INTRAVENOUS

## 2018-03-25 MED ORDER — HEPARIN BOLUS VIA INFUSION
5000.0000 [IU] | Freq: Once | INTRAVENOUS | Status: AC
  Administered 2018-03-25: 5000 [IU] via INTRAVENOUS

## 2018-03-25 MED ORDER — SODIUM CHLORIDE 0.9 % IV SOLN: 250.0000 mL | INTRAVENOUS | Status: DC | PRN

## 2018-03-25 MED ORDER — HEPARIN (PORCINE) IN NACL 100-0.45 UNIT/ML-% IJ SOLN
2750.0000 [IU]/h | INTRAMUSCULAR | Status: AC
  Administered 2018-03-25: 2450 [IU]/h via INTRAVENOUS
  Administered 2018-03-25 (×2): 2000 [IU]/h via INTRAVENOUS
  Administered 2018-03-26: 2750 [IU]/h via INTRAVENOUS
  Filled 2018-03-25 (×4): qty 250

## 2018-03-25 MED ORDER — SODIUM CHLORIDE 0.9% FLUSH: 3.0000 mL | INTRAVENOUS | Status: DC | PRN

## 2018-03-25 NOTE — Progress Notes (Signed)
PROGRESS NOTE  Aaron Chambers:[id number] DOB: August 03, 1980 DOA: 03/24/2018 PCP: Merlyn Albert, MD  Brief History:  37 year old male with a history of anxiety, asthma and superficial thrombophlebitis presenting with 5 to 6-day history of shortness of breath that began on 9019.  Patient is also complaining of cramping of his left lower extremity for about 4 to 5 days.  The patient noted about 1 week ago he had had decreased endurance during his [employer] workouts.  However, he felt that his endurance and shortness of breath improved, so he thought nothing of it.  However in the past 1 to 2 days, he has noted increasing cramping and pain and swelling in his left lower extremity.  As result he sought medical care.  He is complaining of a nonproductive cough.  He denies any fevers, chills, chest pain, nausea, vomiting, diarrhea, abdominal pain, dysuria, hematuria, headache, syncope.  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  He last had an airplane flight up was in the 1 month prior to this admission lasting only 1 to 2 hours.  He denies any recent trauma or surgery.  There is no family history of thrombi embolism.  However, the patient states that he has been taking Arimidex and Clomid as part of infertility regimen.  In the emergency department, the patient was afebrile and hemodynamically stable saturating 96% on room air.  BMP and LFTs were essentially unremarkable.  WBC was 13.0 with platelets 130,000.  CT angiogram of the chest shows pulmonary embolus in the right upper lobe, right middle lobe and right lower lobe as well as in the left descending pulmonary artery.  The patient was started on IV heparin  Assessment/Plan: Acute bilateral PE -Appears to be clinically provoked by clinical history, likely from his antiestrogen therapy -Continue IV heparin -Echocardiogram -Discontinue Arimidex and Clomid for now -Hypercoagulable work-up has already been ordered at  the time of admission; however suspect that his protein C, protein S, Antithrombin III may be low in the acute setting of VTE  Thrombocytopenia -Due to consumption in the acute setting of VTE -Monitor for signs of bleeding -Trend platelets  Morbid obesity -BMI 50.8 -Lifestyle modification  Anxiety -Continue Lexapro -continue clonazepam prn    Disposition Plan:   Home in 1-2 days  Family Communication: No  Family at bedside--Total time spent 35 minutes.  Greater than 50% spent face to face counseling and coordinating care. 8119 to 0720  Consultants:  none  Code Status:  FULL   DVT Prophylaxis:  IV Heparin   Procedures: As Listed in Progress Note Above  Antibiotics: None    Subjective: Patient denies fevers, chills, headache, chest pain, dyspnea, nausea, vomiting, diarrhea, abdominal pain, dysuria, hematuria, hematochezia, and melena.   Objective: Vitals:   03/24/18 2125 03/24/18 2126 03/25/18 0111  BP:  (!) 159/85 129/62  Pulse:  92 65  Resp:  18 18  Temp:  98.9 F (37.2 C) 98.7 F (37.1 C)  TempSrc:  Temporal Oral  SpO2:  95% 96%  Weight: (!) 170.1 kg    Height: 6' (1.829 m)     No intake or output data in the 24 hours ending 03/25/18 0712 Weight change:  Exam:   General:  Pt is alert, follows commands appropriately, not in acute distress  HEENT: No icterus, No thrush, No neck mass, [place]/AT  Cardiovascular: RRR, S1/S2, no rubs, no gallops  Respiratory: CTA bilaterally, no  wheezing, no crackles, no rhonchi  Abdomen: Soft/+BS, non tender, non distended, no guarding  Extremities: L>R 1+ LE edema, No lymphangitis, No petechiae, No rashes, no synovitis   Data Reviewed: I have personally reviewed following labs and imaging studies Basic Metabolic Panel: Recent Labs  Lab 03/24/18 2322 03/25/18 0100  NA 141 138  K 4.1 4.1  CL 105 106  CO2  --  26  GLUCOSE 109* 114*  BUN 16 15  CREATININE 0.90 0.85  CALCIUM  --  9.2   Liver Function  Tests: Recent Labs  Lab 03/25/18 0100  AST 32  ALT 42  ALKPHOS 56  BILITOT 0.9  PROT 7.5  ALBUMIN 4.0   No results for input(s): LIPASE, AMYLASE in the last 168 hours. No results for input(s): AMMONIA in the last 168 hours. Coagulation Profile: Recent Labs  Lab 03/25/18 0100  INR 1.15   CBC: Recent Labs  Lab 03/24/18 2322 03/25/18 0100  WBC  --  13.0*  NEUTROABS  --  8.1*  HGB 13.6 13.7  HCT 40.0 41.7  MCV  --  85.5  PLT  --  138*   Cardiac Enzymes: Recent Labs  Lab 03/25/18 0100  TROPONINI <0.03   BNP: Invalid input(s): POCBNP CBG: No results for input(s): GLUCAP in the last 168 hours. HbA1C: No results for input(s): HGBA1C in the last 72 hours. Urine analysis:    Component Value Date/Time   COLORURINE YELLOW 02/18/2017 2212   APPEARANCEUR HAZY (A) 02/18/2017 2212   LABSPEC 1.025 02/18/2017 2212   PHURINE 5.0 02/18/2017 2212   GLUCOSEU NEGATIVE 02/18/2017 2212   HGBUR LARGE (A) 02/18/2017 2212   BILIRUBINUR NEGATIVE 02/18/2017 2212   KETONESUR 5 (A) 02/18/2017 2212   PROTEINUR 30 (A) 02/18/2017 2212   NITRITE NEGATIVE 02/18/2017 2212   LEUKOCYTESUR NEGATIVE 02/18/2017 2212   Sepsis Labs: @LABRCNTIP (procalcitonin:4,lacticidven:4) )No results found for this or any previous visit (from the past 240 hour(s)).   Scheduled Meds: . escitalopram  20 mg Oral Daily  . multivitamin with minerals  1 tablet Oral Daily  . sodium chloride flush  3 mL Intravenous Q12H   Continuous Infusions: . sodium chloride    . heparin 2,000 Units/hr (03/25/18 0118)    Procedures/Studies: Ct Angio Chest Pe W And/or Wo Contrast  Result Date: 03/25/2018 CLINICAL DATA:  Left lower leg pain history of DVT EXAM: CT ANGIOGRAPHY CHEST WITH CONTRAST TECHNIQUE: Multidetector CT imaging of the chest was performed using the standard protocol during bolus administration of intravenous contrast. Multiplanar CT image reconstructions and MIPs were obtained to evaluate the vascular  anatomy. CONTRAST:  100mL ISOVUE-370 IOPAMIDOL (ISOVUE-370) INJECTION 76% COMPARISON:  Chest x-ray 08/27/2014 FINDINGS: Cardiovascular: Suboptimal opacification of the pulmonary arteries to the segmental level. Filling defects right upper lobe, right lower lobe and right middle lobe segmental and subsegmental pulmonary vessels. Additional filling defect within left descending pulmonary artery and segmental and subsegmental left lower lobe and lingular branch vessels consistent with acute emboli. RV LV ratio is non elevated at 0.87. Nonaneurysmal aorta. Borderline cardiomegaly. No pericardial effusion. Mediastinum/Nodes: No enlarged mediastinal, hilar, or axillary lymph nodes. Thyroid gland, trachea, and esophagus demonstrate no significant findings. Lungs/Pleura: Lungs are clear. No pleural effusion or pneumothorax. 4 mm left upper lobe pulmonary nodule, series 6, image number 26. Upper Abdomen: No acute abnormality. Musculoskeletal: Degenerative changes of the spine. No acute or suspicious abnormality. Review of the MIP images confirms the above findings. IMPRESSION: 1. Limited contrast opacification of pulmonary arterial system, despite this  there are acute bilateral emboli as described above. No evidence for right heart strain. 2. No acute pulmonary infiltrate 3. 4 mm left upper lobe pulmonary nodule. No follow-up needed if patient is low-risk. Non-contrast chest CT can be considered in 12 months if patient is high-risk. This recommendation follows the consensus statement: Guidelines for Management of Incidental Pulmonary Nodules Detected on CT Images: From the Fleischner Society 2017; Radiology 2017; 284:228-243. Critical Value/emergent results were called by telephone at the time of interpretation on 03/25/2018 at 12:09 am to Dr. Marily Memos , who verbally acknowledged these results. Electronically Signed   By: Jasmine Pang M.D.   On: 03/25/2018 00:09    Catarina Hartshorn, DO  [facility] Pager  [phone]  If 7PM-7AM, please contact night-coverage [email or web address] Password [email or web address] 03/25/2018, 7:12 AM   LOS: 0 days

## 2018-03-25 NOTE — Progress Notes (Signed)
ANTICOAGULATION CONSULT NOTE - follow up  Pharmacy Consult for heparin Indication: pulmonary embolus  No Known Allergies  Patient Measurements: Height: 6\' 4"  (193 cm) Weight: (!) 383 lb 11.2 oz (174 kg) IBW/kg (Calculated) : 86.8 HEPARIN DW (KG): 128.2   Vital Signs: Temp: 98.4 F (36.9 C) (09/16 1125) Temp Source: Oral (09/16 1125) BP: 137/70 (09/16 1322) Pulse Rate: 63 (09/16 1322)  Labs: Recent Labs    03/24/18 2322 03/25/18 0100 03/25/18 0824 03/25/18 1818  HGB 13.6 13.7  --   --   HCT 40.0 41.7  --   --   PLT  --  138*  --   --   APTT  --  29  --   --   LABPROT  --  14.6  --   --   INR  --  1.15  --   --   HEPARINUNFRC  --   --  0.17* 0.22*  CREATININE 0.90 0.85  --   --   TROPONINI  --  <0.03  --   --    Estimated Creatinine Clearance: 206.8 mL/min (by C-G formula based on SCr of 0.85 mg/dL).  Medical History: Past Medical History:  Diagnosis Date  . Allergic rhinitis   . Anxiety   . Arrhythmia   . Asthma   . Kidney stones   . Obesity     Medications:  Scheduled:  . escitalopram  20 mg Oral Daily  . multivitamin with minerals  1 tablet Oral Daily  . sodium chloride flush  3 mL Intravenous Q12H   Infusions:  . sodium chloride    . heparin 2,450 Units/hr (03/25/18 2016)   PRN:   Assessment: 37 yo male with history of superficial thrombophlebitis came to ED with leg swelling and pain.  Pt has chest pain and SOB with possible PE.  Starting heparin for acute bilateral PE. Appears to be clinically provoked. Hypercoagulable work up ordered.  Heparin level is subtherapeutic.  Goal of Therapy:  Heparin level 0.3-0.7 units/ml    Plan:  Give heparin bolus 3500 units bolus x 1, then  Increase heparin infusion at 2750 units/hr Check anti-Xa level in 6 hours and daily while on heparin Continue to monitor H&H and platelets  Luan PullingGarrett Onnika Siebel, PharmD, MBA, BCGP Clinical Pharmacist  03/25/2018,9:11 PM

## 2018-03-25 NOTE — Progress Notes (Signed)
ANTICOAGULATION CONSULT NOTE - Preliminary  Pharmacy Consult for heparin Indication: pulmonary embolus  No Known Allergies  Patient Measurements: Height: 6' (182.9 cm) Weight: (!) 375 lb (170.1 kg) IBW/kg (Calculated) : 77.6 HEPARIN DW (KG): 118.9   Vital Signs: Temp: 98.9 F (37.2 C) (09/15 2126) Temp Source: Temporal (09/15 2126) BP: 159/85 (09/15 2126) Pulse Rate: 92 (09/15 2126)  Labs: Recent Labs    03/24/18 2322  HGB 13.6  HCT 40.0  CREATININE 0.90   Estimated Creatinine Clearance: 183.9 mL/min (by C-G formula based on SCr of 0.9 mg/dL).  Medical History: Past Medical History:  Diagnosis Date  . Allergic rhinitis   . Anxiety   . Arrhythmia   . Asthma   . Kidney stones   . Obesity     Medications:  Scheduled:  . heparin  5,000 Units Intravenous Once   Infusions:  . heparin     PRN:   Assessment: 37 yo male with history of superficial thrombophlebitis came to ED with leg swelling and pain.  Pt has chest pain and SOB with possible PE.  Starting heparin for possible PE.   Goal of Therapy:  Heparin level 0.3-0.7 units/ml   Plan:  Give 5000 units bolus x 1 Start heparin infusion at 2000 units/hr Check anti-Xa level in 6 hours and daily while on heparin Continue to monitor H&H and platelets Preliminary review of pertinent patient information completed.  Jeani HawkingAnnie Penn clinical pharmacist will complete review during morning rounds to assess the patient and finalize treatment regimen.  Brysin Towery, Berneice Heinrichiffany Scarlett, RPH 03/25/2018,12:13 AM

## 2018-03-25 NOTE — Progress Notes (Signed)
ANTICOAGULATION CONSULT NOTE - follow up  Pharmacy Consult for heparin Indication: pulmonary embolus  No Known Allergies  Patient Measurements: Height: 6' (182.9 cm) Weight: (!) 375 lb (170.1 kg) IBW/kg (Calculated) : 77.6 HEPARIN DW (KG): 118.9   Vital Signs: Temp: 98.7 F (37.1 C) (09/16 0111) Temp Source: Oral (09/16 0111) BP: 129/62 (09/16 0111) Pulse Rate: 65 (09/16 0111)  Labs: Recent Labs    03/24/18 2322 03/25/18 0100 03/25/18 0824  HGB 13.6 13.7  --   HCT 40.0 41.7  --   PLT  --  138*  --   APTT  --  29  --   LABPROT  --  14.6  --   INR  --  1.15  --   HEPARINUNFRC  --   --  0.17*  CREATININE 0.90 0.85  --   TROPONINI  --  <0.03  --    Estimated Creatinine Clearance: 194.7 mL/min (by C-G formula based on SCr of 0.85 mg/dL).  Medical History: Past Medical History:  Diagnosis Date  . Allergic rhinitis   . Anxiety   . Arrhythmia   . Asthma   . Kidney stones   . Obesity     Medications:  Scheduled:  . escitalopram  20 mg Oral Daily  . multivitamin with minerals  1 tablet Oral Daily  . sodium chloride flush  3 mL Intravenous Q12H   Infusions:  . sodium chloride    . heparin 2,000 Units/hr (03/25/18 1050)   PRN:   Assessment: 37 yo male with history of superficial thrombophlebitis came to ED with leg swelling and pain.  Pt has chest pain and SOB with possible PE.  Starting heparin for acute bilateral PE. Appears to be clinically provoked. Hypercoagulable work up ordered.  Heparin level is subtherapeutic.  Goal of Therapy:  Heparin level 0.3-0.7 units/ml    Plan:  Give heparin bolus 3500 units bolus x 1, then  Increase heparin infusion at 2450 units/hr Check anti-Xa level in 6 hours and daily while on heparin Continue to monitor H&H and platelets  Elder CyphersLorie Sigmond Patalano, BS Loura BackPharm D, BCPS Clinical Pharmacist Pager [phone] 03/25/2018,11:13 AM

## 2018-03-25 NOTE — Progress Notes (Signed)
*  PRELIMINARY RESULTS* Echocardiogram 2D Echocardiogram has been performed with Definity.  Stacey DrainWhite, Tenasia Aull J 03/25/2018, 1:31 PM

## 2018-03-25 NOTE — H&P (Signed)
History and Physical    Aaron Chambers:[id number] DOB: [birth year] DOA: 03/24/2018  PCP: Merlyn Albert, MD   Patient coming from: Home  Chief Complaint: Left lower extremity pain with mild shortness of breath  HPI: Aaron Chambers is a 37 y.o. male with medical history significant for obesity, anxiety, asthma, prior kidney stones, and superficial thrombophlebitis with prior vein stripping who presents to the ED today with left leg swelling and pain that has worsened over the last 2 weeks.  He denies any significant chest pain or shortness of breath aside from when he intensely does physical activity.  He has never had a prior venous thromboembolism.  The swelling has worsened in the last 24 to 48 hours.  He has also had a mild dry cough, but no hemoptysis.  He denies any particular aggravating or alleviating factors. He denies fevers or chills.   ED Course: Vital signs are stable and patient is saturating in the mid 90th percentile on room air with no respiratory distress.  Laboratory data is all within normal limits aside from glucose of 109.  CT of the chest with angiogram demonstrates bilateral PE with no RV strain present.  There is also a 4 mm pulmonary nodule.  He has been started on heparin infusion.  Review of Systems: All others reviewed and otherwise negative.  Past Medical History:  Diagnosis Date  . Allergic rhinitis   . Anxiety   . Arrhythmia   . Asthma   . Kidney stones   . Obesity     Past Surgical History:  Procedure Laterality Date  . CARDIAC ELECTROPHYSIOLOGY STUDY AND ABLATION    . VASCULAR SURGERY       reports that he has never smoked. He has quit using smokeless tobacco. He reports that he does not drink alcohol or use drugs.  No Known Allergies  History reviewed. No pertinent family history.  Prior to Admission medications   Medication Sig Start Date End Date Taking? Authorizing Provider  albuterol (PROVENTIL HFA;VENTOLIN HFA) 108 (90 Base)  MCG/ACT inhaler Inhale 2 puffs into the lungs every 6 (six) hours as needed for wheezing or shortness of breath. 09/10/17  Yes Merlyn Albert, MD  anastrozole (ARIMIDEX) 1 MG tablet Take 1 mg by mouth every morning.  11/30/17 05/29/18 Yes [provider]  clomiPHENE (CLOMID) 50 MG tablet Take 50 mg by mouth every morning.  12/31/17  Yes [provider]  clonazePAM (KLONOPIN) 0.5 MG tablet TAKE (1) TABLET BY MOUTH UP TO BID PRN Patient taking differently: Take 0.5 mg by mouth 2 (two) times daily as needed for anxiety.  01/30/18  Yes Merlyn Albert, MD  escitalopram (LEXAPRO) 20 MG tablet Take 1 tablet (20 mg total) by mouth daily. 01/30/18  Yes Merlyn Albert, MD  Multiple Vitamin (MULTIVITAMIN WITH MINERALS) TABS tablet Take 1 tablet by mouth daily.   Yes [provider]    Physical Exam: Vitals:   03/24/18 2125 03/24/18 2126 03/25/18 0111  BP:  (!) 159/85 129/62  Pulse:  92 65  Resp:  18 18  Temp:  98.9 F (37.2 C) 98.7 F (37.1 C)  TempSrc:  Temporal Oral  SpO2:  95% 96%  Weight: (!) 170.1 kg    Height: 6' (1.829 m)      Constitutional: NAD, calm, comfortable, obese Vitals:   03/24/18 2125 03/24/18 2126 03/25/18 0111  BP:  (!) 159/85 129/62  Pulse:  92 65  Resp:  18 18  Temp:  98.9 F (37.2 C) 98.7 F (37.1 C)  TempSrc:  Temporal Oral  SpO2:  95% 96%  Weight: (!) 170.1 kg    Height: 6' (1.829 m)     Eyes: lids and conjunctivae normal ENMT: Mucous membranes are moist.  Neck: normal, supple Respiratory: clear to auscultation bilaterally. Normal respiratory effort. No accessory muscle use.  Cardiovascular: Regular rate and rhythm, no murmurs.  Left lower extremity with minimal swelling and left calf tenderness.  Some ecchymosis noted to talus. Abdomen: no tenderness, no distention. Bowel sounds positive.  Musculoskeletal:  No joint deformity upper and lower extremities.   Skin: no rashes, lesions, ulcers.  Psychiatric: Normal judgment and  insight. Alert and oriented x 3. Normal mood.   Labs on Admission: I have personally reviewed following labs and imaging studies  CBC: Recent Labs  Lab 03/24/18 2322 03/25/18 0100  WBC  --  13.0*  NEUTROABS  --  8.1*  HGB 13.6 13.7  HCT 40.0 41.7  MCV  --  85.5  PLT  --  138*   Basic Metabolic Panel: Recent Labs  Lab 03/24/18 2322 03/25/18 0100  NA 141 138  K 4.1 4.1  CL 105 106  CO2  --  26  GLUCOSE 109* 114*  BUN 16 15  CREATININE 0.90 0.85  CALCIUM  --  9.2   GFR: Estimated Creatinine Clearance: 194.7 mL/min (by C-G formula based on SCr of 0.85 mg/dL). Liver Function Tests: Recent Labs  Lab 03/25/18 0100  AST 32  ALT 42  ALKPHOS 56  BILITOT 0.9  PROT 7.5  ALBUMIN 4.0   No results for input(s): LIPASE, AMYLASE in the last 168 hours. No results for input(s): AMMONIA in the last 168 hours. Coagulation Profile: Recent Labs  Lab 03/25/18 0100  INR 1.15   Cardiac Enzymes: Recent Labs  Lab 03/25/18 0100  TROPONINI <0.03   BNP (last 3 results) No results for input(s): PROBNP in the last 8760 hours. HbA1C: No results for input(s): HGBA1C in the last 72 hours. CBG: No results for input(s): GLUCAP in the last 168 hours. Lipid Profile: No results for input(s): CHOL, HDL, LDLCALC, TRIG, CHOLHDL, LDLDIRECT in the last 72 hours. Thyroid Function Tests: No results for input(s): TSH, T4TOTAL, FREET4, T3FREE, THYROIDAB in the last 72 hours. Anemia Panel: No results for input(s): VITAMINB12, FOLATE, FERRITIN, TIBC, IRON, RETICCTPCT in the last 72 hours. Urine analysis:    Component Value Date/Time   COLORURINE YELLOW 02/18/2017 2212   APPEARANCEUR HAZY (A) 02/18/2017 2212   LABSPEC 1.025 02/18/2017 2212   PHURINE 5.0 02/18/2017 2212   GLUCOSEU NEGATIVE 02/18/2017 2212   HGBUR LARGE (A) 02/18/2017 2212   BILIRUBINUR NEGATIVE 02/18/2017 2212   KETONESUR 5 (A) 02/18/2017 2212   PROTEINUR 30 (A) 02/18/2017 2212   NITRITE NEGATIVE 02/18/2017 2212    LEUKOCYTESUR NEGATIVE 02/18/2017 2212    Radiological Exams on Admission: Ct Angio Chest Pe W And/or Wo Contrast  Result Date: 03/25/2018 CLINICAL DATA:  Left lower leg pain history of DVT EXAM: CT ANGIOGRAPHY CHEST WITH CONTRAST TECHNIQUE: Multidetector CT imaging of the chest was performed using the standard protocol during bolus administration of intravenous contrast. Multiplanar CT image reconstructions and MIPs were obtained to evaluate the vascular anatomy. CONTRAST:  100mL ISOVUE-370 IOPAMIDOL (ISOVUE-370) INJECTION 76% COMPARISON:  Chest x-ray 08/27/2014 FINDINGS: Cardiovascular: Suboptimal opacification of the pulmonary arteries to the segmental level. Filling defects right upper lobe, right lower lobe and right middle lobe segmental and subsegmental pulmonary vessels. Additional filling defect  within left descending pulmonary artery and segmental and subsegmental left lower lobe and lingular branch vessels consistent with acute emboli. RV LV ratio is non elevated at 0.87. Nonaneurysmal aorta. Borderline cardiomegaly. No pericardial effusion. Mediastinum/Nodes: No enlarged mediastinal, hilar, or axillary lymph nodes. Thyroid gland, trachea, and esophagus demonstrate no significant findings. Lungs/Pleura: Lungs are clear. No pleural effusion or pneumothorax. 4 mm left upper lobe pulmonary nodule, series 6, image number 26. Upper Abdomen: No acute abnormality. Musculoskeletal: Degenerative changes of the spine. No acute or suspicious abnormality. Review of the MIP images confirms the above findings. IMPRESSION: 1. Limited contrast opacification of pulmonary arterial system, despite this there are acute bilateral emboli as described above. No evidence for right heart strain. 2. No acute pulmonary infiltrate 3. 4 mm left upper lobe pulmonary nodule. No follow-up needed if patient is low-risk. Non-contrast chest CT can be considered in 12 months if patient is high-risk. This recommendation follows the  consensus statement: Guidelines for Management of Incidental Pulmonary Nodules Detected on CT Images: From the Fleischner Society 2017; Radiology 2017; 284:228-243. Critical Value/emergent results were called by telephone at the time of interpretation on 03/25/2018 at 12:09 am to Dr. Marily Memos , who verbally acknowledged these results. Electronically Signed   By: Jasmine Pang M.D.   On: 03/25/2018 00:09    EKG: Independently reviewed.  Sinus rhythm, 72 bpm.  Assessment/Plan Principal Problem:   Pulmonary embolism (HCC) Active Problems:   Asthma, chronic   Morbid obesity (HCC)   Anxiety disorder    1. Acute bilateral PE.  Continue on heparin infusion for now and likely discharge in a.m. with anticoagulation.  Patient has no RV strain or other significant symptoms at this time, and therefore echocardiogram is not needed.  Suspect DVT to left lower extremity for which ultrasound of left lower extremity has been ordered. 2. Chronic asthma.  No chest tightness or wheezing currently noted.  Consider duo nebs as needed. 3. Anxiety disorder.  Continue home Lexapro and Klonopin. 4. Morbid obesity.  Patient working on obesity with diet and exercise.   DVT prophylaxis: Heparin infusion Code Status: Full Family Communication: None at bedside Disposition Plan: Treatment of PE with heparin infusion; DC in AM with NOAC; DVT evaluation Consults called: None Admission status: Observation, telemetry   Pratik Hoover Brunette DO [facility] Pager [phone]  If 7PM-7AM, please contact night-coverage [email or web address] Password [email or web address]  03/25/2018, 2:06 AM

## 2018-03-26 DIAGNOSIS — D696 Thrombocytopenia, unspecified: Secondary | ICD-10-CM | POA: Diagnosis not present

## 2018-03-26 DIAGNOSIS — I2699 Other pulmonary embolism without acute cor pulmonale: Secondary | ICD-10-CM | POA: Diagnosis not present

## 2018-03-26 LAB — BASIC METABOLIC PANEL
ANION GAP: 9 (ref 5–15)
BUN: 10 mg/dL (ref 6–20)
CO2: 25 mmol/L (ref 22–32)
Calcium: 8.9 mg/dL (ref 8.9–10.3)
Chloride: 107 mmol/L (ref 98–111)
Creatinine, Ser: 0.75 mg/dL (ref 0.61–1.24)
Glucose, Bld: 113 mg/dL — ABNORMAL HIGH (ref 70–99)
Potassium: 4 mmol/L (ref 3.5–5.1)
Sodium: 141 mmol/L (ref 135–145)

## 2018-03-26 LAB — CARDIOLIPIN ANTIBODIES, IGG, IGM, IGA: Anticardiolipin IgM: <9 MPL U/mL (ref 0–12)

## 2018-03-26 LAB — CBC
HEMATOCRIT: 41.9 % (ref 39.0–52.0)
Hemoglobin: 13.7 g/dL (ref 13.0–17.0)
MCH: 27.9 pg (ref 26.0–34.0)
MCHC: 32.7 g/dL (ref 30.0–36.0)
MCV: 85.3 fL (ref 78.0–100.0)
PLATELETS: 150 10*3/uL (ref 150–400)
RBC: 4.91 MIL/uL (ref 4.22–5.81)
RDW: 14 % (ref 11.5–15.5)
WBC: 11.4 10*3/uL — AB (ref 4.0–10.5)

## 2018-03-26 LAB — BETA-2-GLYCOPROTEIN I ABS, IGG/M/A
Beta-2-Glycoprotein I IgA: <9 GPI IgA units (ref 0–25)
Beta-2-Glycoprotein I IgM: <9 GPI IgM units (ref 0–32)

## 2018-03-26 LAB — LUPUS ANTICOAGULANT PANEL
DRVVT: 42.3 s (ref 0.0–47.0)
PTT Lupus Anticoagulant: 36.4 s (ref 0.0–51.9)

## 2018-03-26 LAB — PROTEIN S ACTIVITY: PROTEIN S ACTIVITY: 100 % (ref 63–140)

## 2018-03-26 LAB — HIV ANTIBODY (ROUTINE TESTING W REFLEX): HIV SCREEN 4TH GENERATION: NONREACTIVE

## 2018-03-26 LAB — PROTEIN S, TOTAL: PROTEIN S AG TOTAL: 96 % (ref 60–150)

## 2018-03-26 LAB — HOMOCYSTEINE: HOMOCYSTEINE-NORM: 16 umol/L — AB (ref 0.0–15.0)

## 2018-03-26 LAB — HEPARIN LEVEL (UNFRACTIONATED): HEPARIN UNFRACTIONATED: 0.39 [IU]/mL (ref 0.30–0.70)

## 2018-03-26 LAB — PROTEIN C ACTIVITY: PROTEIN C ACTIVITY: 85 % (ref 73–180)

## 2018-03-26 LAB — PROTEIN C, TOTAL: Protein C, Total: 86 % (ref 60–150)

## 2018-03-26 MED ORDER — APIXABAN 5 MG PO TABS: 5.0000 mg | ORAL_TABLET | Freq: Two times a day (BID) | ORAL | Status: DC

## 2018-03-26 MED ORDER — APIXABAN 5 MG PO TABS: 10.0000 mg | ORAL_TABLET | Freq: Two times a day (BID) | ORAL | 0 refills | Status: DC

## 2018-03-26 MED ORDER — APIXABAN 5 MG PO TABS
10.0000 mg | ORAL_TABLET | Freq: Two times a day (BID) | ORAL | Status: DC
  Administered 2018-03-26: 10 mg via ORAL
  Filled 2018-03-26: qty 2

## 2018-03-26 NOTE — Progress Notes (Signed)
Pt's IV catheter removed and intact. Pt's IV site clean dry and intact. Discharge instructions including medications were reviewed and discussed with patient. All questions were answered and no further questions at this time. Pt in stable condition and in no acute distress at time of discharge. Pt escorted by RN.  

## 2018-03-26 NOTE — Discharge Summary (Addendum)
Physician Discharge Summary  Aaron Chambers VWU:[id number] DOB: 24-Sep-1980 DOA: 03/24/2018  PCP: Merlyn Albert, MD  Admit date: 03/24/2018 Discharge date: 03/26/2018  Admitted From: Home Disposition:  Home   Recommendations for Outpatient Follow-up:  1. Follow up with PCP in 1-2 weeks 2. Please obtain BMP/CBC in one week  3. Please follow up on hypercoagulable labs     Discharge Condition: Stable CODE STATUS: FULL Diet recommendation: Heart Healthy  Brief/Interim Summary: 37 year old male with a history of anxiety, asthma and superficial thrombophlebitis presenting with 5 to 6-day history of shortness of breath that began on 9019.  Patient is also complaining of cramping of his left lower extremity for about 4 to 5 days.  The patient noted about 1 week ago he had had decreased endurance during his [employer] workouts.  However, he felt that his endurance and shortness of breath improved, so he thought nothing of it.  However in the past 1 to 2 days, he has noted increasing cramping and pain and swelling in his left lower extremity.  As result he sought medical care.  He is complaining of a nonproductive cough.  He denies any fevers, chills, chest pain, nausea, vomiting, diarrhea, abdominal pain, dysuria, hematuria, headache, syncope.  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  He last had an airplane flight up was in the 1 month prior to this admission lasting only 1 to 2 hours.  He denies any recent trauma or surgery.  There is no family history of thrombi embolism.  However, the patient states that he has been taking Arimidex and Clomid as part of infertility regimen.  In the emergency department, the patient was afebrile and hemodynamically stable saturating 96% on room air.  BMP and LFTs were essentially unremarkable.  WBC was 13.0 with platelets 130,000.  CT angiogram of the chest shows pulmonary embolus in the right upper lobe, right middle lobe and right lower  lobe as well as in the left descending pulmonary artery.  The patient was started on IV heparin  Discharge Diagnoses:  Acute bilateral PE -Appears to be clinically provoked by clinical history, likely from his antiestrogen therapy -Continue IV heparin>>>transitioned to apixaban 10 mg bid x 7 days, then 5 mg bid -Echocardiogram--EF 55-60%, no WMA, normal RV function -Discontinue Arimidex and Clomid for now and follow up with his reproductive specialist -Hypercoagulable work-up has already been ordered at the time of admission; however suspect that his protein C, protein S, Antithrombin III may be low in the acute setting of VTE -LLE US--+DVT  Thrombocytopenia -Due to consumption in the acute setting of VTE -Monitor for signs of bleeding -Trend platelets>>>trending up  Morbid obesity -BMI 50.8 -Lifestyle modification  Anxiety -Continue Lexapro -continue clonazepam prn  Discharge Instructions   Allergies as of 03/26/2018   No Known Allergies     Medication List    STOP taking these medications   anastrozole 1 MG tablet Commonly known as:  ARIMIDEX   clomiPHENE 50 MG tablet Commonly known as:  CLOMID     TAKE these medications   albuterol 108 (90 Base) MCG/ACT inhaler Commonly known as:  PROVENTIL HFA;VENTOLIN HFA Inhale 2 puffs into the lungs every 6 (six) hours as needed for wheezing or shortness of breath.   apixaban 5 MG Tabs tablet Commonly known as:  ELIQUIS Take 2 tablets (10 mg total) by mouth 2 (two) times daily. Then start 1 tablet (5 mg) two times daily on 04/02/18  clonazePAM 0.5 MG tablet Commonly known as:  KLONOPIN TAKE (1) TABLET BY MOUTH UP TO BID PRN What changed:    how much to take  how to take this  when to take this  reasons to take this  additional instructions   escitalopram 20 MG tablet Commonly known as:  LEXAPRO Take 1 tablet (20 mg total) by mouth daily.   multivitamin with minerals Tabs tablet Take 1 tablet by mouth  daily.       No Known Allergies  Consultations:  none   Procedures/Studies: Ct Angio Chest Pe W And/or Wo Contrast  Result Date: 03/25/2018 CLINICAL DATA:  Left lower leg pain history of DVT EXAM: CT ANGIOGRAPHY CHEST WITH CONTRAST TECHNIQUE: Multidetector CT imaging of the chest was performed using the standard protocol during bolus administration of intravenous contrast. Multiplanar CT image reconstructions and MIPs were obtained to evaluate the vascular anatomy. CONTRAST:  ISOVUE-370 IOPAMIDOL (ISOVUE-370) INJECTION 76% COMPARISON:  Chest x-ray 08/27/2014 FINDINGS: Cardiovascular: Suboptimal opacification of the pulmonary arteries to the segmental level. Filling defects right upper lobe, right lower lobe and right middle lobe segmental and subsegmental pulmonary vessels. Additional filling defect within left descending pulmonary artery and segmental and subsegmental left lower lobe and lingular branch vessels consistent with acute emboli. RV LV ratio is non elevated at 0.87. Nonaneurysmal aorta. Borderline cardiomegaly. No pericardial effusion. Mediastinum/Nodes: No enlarged mediastinal, hilar, or axillary lymph nodes. Thyroid gland, trachea, and esophagus demonstrate no significant findings. Lungs/Pleura: Lungs are clear. No pleural effusion or pneumothorax. 4 mm left upper lobe pulmonary nodule, series 6, image number 26. Upper Abdomen: No acute abnormality. Musculoskeletal: Degenerative changes of the spine. No acute or suspicious abnormality. Review of the MIP images confirms the above findings. IMPRESSION: 1. Limited contrast opacification of pulmonary arterial system, despite this there are acute bilateral emboli as described above. No evidence for right heart strain. 2. No acute pulmonary infiltrate 3. 4 mm left upper lobe pulmonary nodule. No follow-up needed if patient is low-risk. Non-contrast chest CT can be considered in 12 months if patient is high-risk. This recommendation  follows the consensus statement: Guidelines for Management of Incidental Pulmonary Nodules Detected on CT Images: From the Fleischner Society 2017; Radiology 2017; 284:228-243. Critical Value/emergent results were called by telephone at the time of interpretation on 03/25/2018 at 12:09 am to Dr. Marily Memos , who verbally acknowledged these results. Electronically Signed   By: Jasmine Pang M.D.   On: 03/25/2018 00:09   US Venous Img Lower Unilateral Left  Result Date: 03/25/2018 CLINICAL DATA:  Left lower extremity pain and edema. Acute pulmonary embolism. EXAM: LEFT LOWER EXTREMITY VENOUS DOPPLER ULTRASOUND TECHNIQUE: Gray-scale sonography with graded compression, as well as color Doppler and duplex ultrasound were performed to evaluate the lower extremity deep venous systems from the level of the common femoral vein and including the common femoral, femoral, profunda femoral, popliteal and calf veins including the posterior tibial, peroneal and gastrocnemius veins when visible. The superficial great saphenous vein was also interrogated. Spectral Doppler was utilized to evaluate flow at rest and with distal augmentation maneuvers in the common femoral, femoral and popliteal veins. COMPARISON:  None. FINDINGS: Contralateral Common Femoral Vein: Respiratory phasicity is normal and symmetric with the symptomatic side. No evidence of thrombus. Normal compressibility. Common Femoral Vein: No evidence of thrombus. Normal compressibility, respiratory phasicity and response to augmentation. Saphenofemoral Junction: No evidence of thrombus. Normal compressibility and flow on color Doppler imaging. Profunda Femoral Vein: No evidence of thrombus. Normal  compressibility and flow on color Doppler imaging. Femoral Vein: No evidence of thrombus. Normal compressibility, respiratory phasicity and response to augmentation. Popliteal Vein: There is occlusive thrombus in the left popliteal vein. Calf Veins: Limited visualization  of calf veins in the lower leg. No obvious DVT at the level of the calf veins. Superficial Great Saphenous Vein: No evidence of thrombus. Normal compressibility. Venous Reflux:  None. Other Findings: No evidence of superficial thrombophlebitis or abnormal fluid collection. IMPRESSION: Positive for occlusive DVT in the left popliteal vein. Electronically Signed   By: Irish LackGlenn  Yamagata M.D.   On: 03/25/2018 08:06         Discharge Exam: Vitals:   03/25/18 2201 03/26/18 0550  BP: 133/75 (!) 144/76  Pulse: 77 66  Resp:    Temp: 98.9 F (37.2 C) 99.1 F (37.3 C)  SpO2: 98% 96%   Vitals:   03/25/18 1224 03/25/18 1322 03/25/18 2201 03/26/18 0550  BP:  137/70 133/75 (!) 144/76  Pulse:  63 77 66  Resp:  18    Temp:   98.9 F (37.2 C) 99.1 F (37.3 C)  TempSrc:   Oral Oral  SpO2:  95% 98% 96%  Weight: (!) 174 kg     Height: 6\' 4"  (1.93 m)       General: Pt is alert, awake, not in acute distress Cardiovascular: RRR, S1/S2 +, no rubs, no gallops Respiratory: CTA bilaterally, no wheezing, no rhonchi Abdominal: Soft, NT, ND, bowel sounds + Extremities: 1+LLE>RLE edema, no cyanosis   The results of significant diagnostics from this hospitalization (including imaging, microbiology, ancillary and laboratory) are listed below for reference.    Significant Diagnostic Studies: Ct Angio Chest Pe W And/or Wo Contrast  Result Date: 03/25/2018 CLINICAL DATA:  Left lower leg pain history of DVT EXAM: CT ANGIOGRAPHY CHEST WITH CONTRAST TECHNIQUE: Multidetector CT imaging of the chest was performed using the standard protocol during bolus administration of intravenous contrast. Multiplanar CT image reconstructions and MIPs were obtained to evaluate the vascular anatomy. CONTRAST:  100mL ISOVUE-370 IOPAMIDOL (ISOVUE-370) INJECTION 76% COMPARISON:  Chest x-ray 08/27/2014 FINDINGS: Cardiovascular: Suboptimal opacification of the pulmonary arteries to the segmental level. Filling defects right upper  lobe, right lower lobe and right middle lobe segmental and subsegmental pulmonary vessels. Additional filling defect within left descending pulmonary artery and segmental and subsegmental left lower lobe and lingular branch vessels consistent with acute emboli. RV LV ratio is non elevated at 0.87. Nonaneurysmal aorta. Borderline cardiomegaly. No pericardial effusion. Mediastinum/Nodes: No enlarged mediastinal, hilar, or axillary lymph nodes. Thyroid gland, trachea, and esophagus demonstrate no significant findings. Lungs/Pleura: Lungs are clear. No pleural effusion or pneumothorax. 4 mm left upper lobe pulmonary nodule, series 6, image number 26. Upper Abdomen: No acute abnormality. Musculoskeletal: Degenerative changes of the spine. No acute or suspicious abnormality. Review of the MIP images confirms the above findings. IMPRESSION: 1. Limited contrast opacification of pulmonary arterial system, despite this there are acute bilateral emboli as described above. No evidence for right heart strain. 2. No acute pulmonary infiltrate 3. 4 mm left upper lobe pulmonary nodule. No follow-up needed if patient is low-risk. Non-contrast chest CT can be considered in 12 months if patient is high-risk. This recommendation follows the consensus statement: Guidelines for Management of Incidental Pulmonary Nodules Detected on CT Images: From the Fleischner Society 2017; Radiology 2017; 284:228-243. Critical Value/emergent results were called by telephone at the time of interpretation on 03/25/2018 at 12:09 am to Dr. Marily MemosJASON MESNER , who verbally acknowledged these  results. Electronically Signed   By: Jasmine Pang M.D.   On: 03/25/2018 00:09   US Venous Img Lower Unilateral Left  Result Date: 03/25/2018 CLINICAL DATA:  Left lower extremity pain and edema. Acute pulmonary embolism. EXAM: LEFT LOWER EXTREMITY VENOUS DOPPLER ULTRASOUND TECHNIQUE: Gray-scale sonography with graded compression, as well as color Doppler and duplex  ultrasound were performed to evaluate the lower extremity deep venous systems from the level of the common femoral vein and including the common femoral, femoral, profunda femoral, popliteal and calf veins including the posterior tibial, peroneal and gastrocnemius veins when visible. The superficial great saphenous vein was also interrogated. Spectral Doppler was utilized to evaluate flow at rest and with distal augmentation maneuvers in the common femoral, femoral and popliteal veins. COMPARISON:  None. FINDINGS: Contralateral Common Femoral Vein: Respiratory phasicity is normal and symmetric with the symptomatic side. No evidence of thrombus. Normal compressibility. Common Femoral Vein: No evidence of thrombus. Normal compressibility, respiratory phasicity and response to augmentation. Saphenofemoral Junction: No evidence of thrombus. Normal compressibility and flow on color Doppler imaging. Profunda Femoral Vein: No evidence of thrombus. Normal compressibility and flow on color Doppler imaging. Femoral Vein: No evidence of thrombus. Normal compressibility, respiratory phasicity and response to augmentation. Popliteal Vein: There is occlusive thrombus in the left popliteal vein. Calf Veins: Limited visualization of calf veins in the lower leg. No obvious DVT at the level of the calf veins. Superficial Great Saphenous Vein: No evidence of thrombus. Normal compressibility. Venous Reflux:  None. Other Findings: No evidence of superficial thrombophlebitis or abnormal fluid collection. IMPRESSION: Positive for occlusive DVT in the left popliteal vein. Electronically Signed   By: Irish Lack M.D.   On: 03/25/2018 08:06     Microbiology: No results found for this or any previous visit (from the past 240 hour(s)).   Labs: Basic Metabolic Panel: Recent Labs  Lab 03/24/18 2322 03/25/18 0100 03/26/18 0437  NA 141 138 141  K 4.1 4.1 4.0  CL 105 106 107  CO2  --  26 25  GLUCOSE 109* 114* 113*  BUN 16 15  10   CREATININE 0.90 0.85 0.75  CALCIUM  --  9.2 8.9   Liver Function Tests: Recent Labs  Lab 03/25/18 0100  AST 32  ALT 42  ALKPHOS 56  BILITOT 0.9  PROT 7.5  ALBUMIN 4.0   No results for input(s): LIPASE, AMYLASE in the last 168 hours. No results for input(s): AMMONIA in the last 168 hours. CBC: Recent Labs  Lab 03/24/18 2322 03/25/18 0100 03/26/18 0437  WBC  --  13.0* 11.4*  NEUTROABS  --  8.1*  --   HGB 13.6 13.7 13.7  HCT 40.0 41.7 41.9  MCV  --  85.5 85.3  PLT  --  138* 150   Cardiac Enzymes: Recent Labs  Lab 03/25/18 0100  TROPONINI <0.03   BNP: Invalid input(s): POCBNP CBG: No results for input(s): GLUCAP in the last 168 hours.  Time coordinating discharge:  36 minutes  Signed:  Catarina Hartshorn, DO [facility] Pager: [phone] 03/26/2018, 12:16 PM

## 2018-03-26 NOTE — Progress Notes (Signed)
ANTICOAGULATION CONSULT NOTE - follow up  Pharmacy Consult for apixaban Indication: pulmonary embolus  No Known Allergies  Patient Measurements: Height: 6\' 4"  (193 cm) Weight: (!) 383 lb 11.2 oz (174 kg) IBW/kg (Calculated) : 86.8 HEPARIN DW (KG): 128.2   Vital Signs: Temp: 99.1 F (37.3 C) (09/17 0550) Temp Source: Oral (09/17 0550) BP: 144/76 (09/17 0550) Pulse Rate: 66 (09/17 0550)  Labs: Recent Labs    03/24/18 2322 03/25/18 0100 03/25/18 0824 03/25/18 1818 03/26/18 0437  HGB 13.6 13.7  --   --  13.7  HCT 40.0 41.7  --   --  41.9  PLT  --  138*  --   --  150  APTT  --  29  --   --   --   LABPROT  --  14.6  --   --   --   INR  --  1.15  --   --   --   HEPARINUNFRC  --   --  0.17* 0.22* 0.39  CREATININE 0.90 0.85  --   --  0.75  TROPONINI  --  <0.03  --   --   --    Estimated Creatinine Clearance: 219.7 mL/min (by C-G formula based on SCr of 0.75 mg/dL).  Medical History: Past Medical History:  Diagnosis Date  . Allergic rhinitis   . Anxiety   . Arrhythmia   . Asthma   . Kidney stones   . Obesity     Medications:  Scheduled:  . apixaban  10 mg Oral BID   Followed by  . [START ON 04/02/2018] apixaban  5 mg Oral BID  . escitalopram  20 mg Oral Daily  . multivitamin with minerals  1 tablet Oral Daily  . sodium chloride flush  3 mL Intravenous Q12H   Infusions:  . sodium chloride    . heparin 2,750 Units/hr (03/26/18 0456)   PRN:   Assessment: 37 yo male with history of superficial thrombophlebitis came to ED with leg swelling and pain.  Pt has chest pain and SOB with possible PE.  Started heparin for acute bilateral PE. Transitioning to apixaban today.  Goal of Therapy:  Monitor platelets   Plan:  Discontinue heparin Apixaban 10 mg BID x 7 days followed by Apixaban 5 mg BID Continue to monitor H&H and platelets  Judeth CornfieldSteven Azriel Dancy, PharmD Clinical Pharmacist 03/26/2018 10:29 AM

## 2018-03-26 NOTE — Discharge Instructions (Signed)
Information on my medicine - ELIQUIS (apixaban)  This medication education was reviewed with me or my healthcare representative as part of my discharge preparation.    Why was Eliquis prescribed for you? Eliquis was prescribed to treat blood clots that may have been found in the veins of your legs (deep vein thrombosis) or in your lungs (pulmonary embolism) and to reduce the risk of them occurring again.  What do You need to know about Eliquis ? The starting dose is 10 mg (two 5 mg tablets) taken TWICE daily for the FIRST SEVEN (7) DAYS, then on (enter date)  ***  the dose is reduced to ONE 5 mg tablet taken TWICE daily.  Eliquis may be taken with or without food.   Try to take the dose about the same time in the morning and in the evening. If you have difficulty swallowing the tablet whole please discuss with your pharmacist how to take the medication safely.  Take Eliquis exactly as prescribed and DO NOT stop taking Eliquis without talking to the doctor who prescribed the medication.  Stopping may increase your risk of developing a new blood clot.  Refill your prescription before you run out.  After discharge, you should have regular check-up appointments with your healthcare provider that is prescribing your Eliquis.    What do you do if you miss a dose? If a dose of ELIQUIS is not taken at the scheduled time, take it as soon as possible on the same day and twice-daily administration should be resumed. The dose should not be doubled to make up for a missed dose.  Important Safety Information A possible side effect of Eliquis is bleeding. You should call your healthcare provider right away if you experience any of the following: Bleeding from an injury or your nose that does not stop. Unusual colored urine (red or dark brown) or unusual colored stools (red or black). Unusual bruising for unknown reasons. A serious fall or if you hit your head (even if there is no  bleeding).  Some medicines may interact with Eliquis and might increase your risk of bleeding or clotting while on Eliquis. To help avoid this, consult your healthcare provider or pharmacist prior to using any new prescription or non-prescription medications, including herbals, vitamins, non-steroidal anti-inflammatory drugs (NSAIDs) and supplements.  This website has more information on Eliquis (apixaban): http://www.eliquis.com/eliquis/home  

## 2018-03-26 NOTE — Care Management (Signed)
Pharmacy will do education on Eliquis and give patient 30 day coupon.

## 2018-03-29 LAB — FACTOR 5 LEIDEN

## 2018-04-01 LAB — PROTHROMBIN GENE MUTATION

## 2018-04-11 NOTE — Progress Notes (Signed)
BH MD/PA/NP OP Progress Note  04/17/2018 11:11 AM Aaron Chambers  MRN:  [id number]  Chief Complaint:  Chief Complaint    Depression; Follow-up     HPI:  Patient presents for follow-up appointment for anxiety.  He discontinued Clomid after recent admission to the hospital due to DVT and PE.  He has been feeling physically better since then.  He denies any depression or SI.  He states that he tends to feel more anxious when he is around with people, although he denies having any difficulty going to grocery store.  He believes that it has been affecting their life given they have not be able to join the social activity due to him. He has intense anxiety when he is with his father.  He also tends to take things personally and ruminates on it while other people does not mean it.  He uses clonazepam few times per week for intense anxiety, although it used to be every day.  He finds helpful to be on Lexapro.  He denies irritability.  He has fair sleep.   His wife presents to the interview.  She states that she has never seen him depressed that way, referring to the episode of him being on clomiphene. She notices that Aaron Chambers tends to be more anxious when he is with his father.  His father up abruptly goes to someplace without any notice due to anxiety. She thinks that Aaron Chambers has similar pattern of behavior, although it is less than his father. She denies safety concern.   Visit Diagnosis:    ICD-10-CM   1. Anxiety disorder, unspecified type F41.9     Past Psychiatric History: Please see initial evaluation for full details. I have reviewed the history. No updates at this time.     Past Medical History:  Past Medical History:  Diagnosis Date  . Allergic rhinitis   . Anxiety   . Arrhythmia   . Asthma   . Kidney stones   . Obesity     Past Surgical History:  Procedure Laterality Date  . CARDIAC ELECTROPHYSIOLOGY STUDY AND ABLATION    . VASCULAR SURGERY      Family Psychiatric History:  Please see initial evaluation for full details. I have reviewed the history. No updates at this time.     Family History: No family history on file.  Social History:  Social History   Socioeconomic History  . Marital status: Married    Spouse name: Not on file  . Number of children: Not on file  . Years of education: Not on file  . Highest education level: Not on file  Occupational History  . Not on file  Social Needs  . Financial resource strain: Not on file  . Food insecurity:    Worry: Not on file    Inability: Not on file  . Transportation needs:    Medical: Not on file    Non-medical: Not on file  Tobacco Use  . Smoking status: Never Smoker  . Smokeless tobacco: Former Engineer, water and Sexual Activity  . Alcohol use: No  . Drug use: No  . Sexual activity: Not on file  Lifestyle  . Physical activity:    Days per week: Not on file    Minutes per session: Not on file  . Stress: Not on file  Relationships  . Social connections:    Talks on phone: Not on file    Gets together: Not on file    Attends religious  service: Not on file    Active member of club or organization: Not on file    Attends meetings of clubs or organizations: Not on file    Relationship status: Not on file  Other Topics Concern  . Not on file  Social History Narrative  . Not on file    Allergies: No Known Allergies  Metabolic Disorder Labs: No results found for: HGBA1C, MPG No results found for: PROLACTIN Lab Results  Component Value Date   CHOL 169 06/09/2016   TRIG 81 06/09/2016   HDL 45 06/09/2016   CHOLHDL 3.8 06/09/2016   VLDL 25 03/23/2014   LDLCALC 108 (H) 06/09/2016   LDLCALC 82 04/29/2015   Lab Results  Component Value Date   TSH 1.240 11/03/2015   TSH 1.560 03/23/2014    Therapeutic Level Labs: No results found for: LITHIUM No results found for: VALPROATE No components found for:  CBMZ  Current Medications: Current Outpatient Medications  Medication Sig  Dispense Refill  . albuterol (PROVENTIL HFA;VENTOLIN HFA) 108 (90 Base) MCG/ACT inhaler Inhale 2 puffs into the lungs every 6 (six) hours as needed for wheezing or shortness of breath. 1 Inhaler 2  . apixaban (ELIQUIS) 5 MG TABS tablet Take 2 tablets (10 mg total) by mouth 2 (two) times daily. Then start 1 tablet (5 mg) two times daily on 04/02/18 60 tablet 0  . clonazePAM (KLONOPIN) 0.5 MG tablet TAKE (1) TABLET BY MOUTH UP TO BID PRN (Patient taking differently: Take 0.5 mg by mouth 2 (two) times daily as needed for anxiety. ) 30 tablet 5  . escitalopram (LEXAPRO) 20 MG tablet Take 1 tablet (20 mg total) by mouth daily. 30 tablet 5  . Multiple Vitamin (MULTIVITAMIN WITH MINERALS) TABS tablet Take 1 tablet by mouth daily.     No current facility-administered medications for this visit.      Musculoskeletal: Strength & Muscle Tone: within normal limits Gait & Station: normal Patient leans: N/A  Psychiatric Specialty Exam: Review of Systems  Psychiatric/Behavioral: Negative for depression, hallucinations, memory loss, substance abuse and suicidal ideas. The patient is nervous/anxious. The patient does not have insomnia.   All other systems reviewed and are negative.   Blood pressure (!) 144/70, pulse 60, height 6\' 4"  (1.93 m), weight (!) 381 lb (172.8 kg), SpO2 96 %.Body mass index is 46.38 kg/m.  General Appearance: Fairly Groomed  Eye Contact:  Good  Speech:  Clear and Coherent  Volume:  Normal  Mood:  Anxious  Affect:  Appropriate, Congruent and Full Range  Thought Process:  Coherent  Orientation:  Full (Time, Place, and Person)  Thought Content: Logical   Suicidal Thoughts:  No  Homicidal Thoughts:  No  Memory:  Immediate;   Good  Judgement:  Good  Insight:  Fair  Psychomotor Activity:  Normal  Concentration:  Concentration: Good and Attention Span: Good  Recall:  Good  Fund of Knowledge: Good  Language: Good  Akathisia:  No  Handed:  Right  AIMS (if indicated): not  done  Assets:  Communication Skills Desire for Improvement  ADL's:  Intact  Cognition: WNL  Sleep:  Poor   Screenings: GAD-7     Office Visit from 01/18/2018 in [facility] Office Visit from 01/07/2018 in [facility] Office Visit from 07/06/2017 in [facility]  Total GAD-7 Score  11  18  11     PHQ2-9     Office Visit from 01/30/2018 in [facility] Office Visit from 01/18/2018  in [facility] Office Visit from 01/07/2018 in [facility] Office Visit from 07/06/2017 in [facility]  PHQ-2 Total Score  0  1  4  1   PHQ-9 Total Score  2  3  15  5        Assessment and Plan:  PABLO STAUFFER is a 37 y.o. year old male with a history of anxiety,hypertension, OSA , who presents for follow up appointment for Anxiety disorder, unspecified type  # Unspecified anxiety disorder He reports occasional intense anxiety was social anxiety since the last appointment.  Will continue Lexapro to target anxiety.  Will continue clonazepam as needed for anxiety .  Discussed risk of oversedation and dependence.  He will greatly benefit from CBT, will make referral.  Noted that although he did have SI, it was likely secondary to Clomid, given it resolved after he discontinued this medication.  Will continue to monitor.    Plan I have reviewed and updated plans as below 1. Continue lexapro 20 mg daily  2. Return to clinic in one month for 15 mins 3. Referral to therapy - on clonazepam 0.5 mg twice a day as needed for anxiety   The patient demonstrates the following risk factors for suicide: Chronic risk factors for suicide include: psychiatric disorder of anxiety. Acute risk factors for suicide include: N/A. Protective factors for this patient include: positive social support, responsibility to others (children, family), coping skills and hope for the future. Considering these factors, the overall suicide risk at  this point appears to be low. Patient is appropriate for outpatient follow up. Although he does have access to guns, those are locked. He also asked his wife to monitor him given his recent episode.  The duration of this appointment visit was 30 minutes of face-to-face time with the patient.  Greater than 50% of this time was spent in counseling, explanation of  diagnosis, planning of further management, and coordination of care.  Neysa Hotter, MD 04/17/2018, 11:11 AM

## 2018-04-17 ENCOUNTER — Ambulatory Visit (INDEPENDENT_AMBULATORY_CARE_PROVIDER_SITE_OTHER): Payer: 59 | Admitting: Psychiatry

## 2018-04-17 ENCOUNTER — Encounter (HOSPITAL_COMMUNITY): Payer: Self-pay | Admitting: Psychiatry

## 2018-04-17 VITALS — BP 144/70 | HR 60 | Ht 76.0 in | Wt 381.0 lb

## 2018-04-17 DIAGNOSIS — F419 Anxiety disorder, unspecified: Secondary | ICD-10-CM | POA: Diagnosis not present

## 2018-04-17 NOTE — Patient Instructions (Signed)
1. Continue lexapro 20 mg daily  2. Return to clinic in one month for 15 mins 3. Referral to therapy

## 2018-04-25 ENCOUNTER — Encounter (HOSPITAL_COMMUNITY): Payer: Self-pay | Admitting: Licensed Clinical Social Worker

## 2018-04-25 ENCOUNTER — Ambulatory Visit (INDEPENDENT_AMBULATORY_CARE_PROVIDER_SITE_OTHER): Payer: 59 | Admitting: Licensed Clinical Social Worker

## 2018-04-25 DIAGNOSIS — F419 Anxiety disorder, unspecified: Secondary | ICD-10-CM | POA: Diagnosis not present
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
## 2018-05-09 ENCOUNTER — Ambulatory Visit (INDEPENDENT_AMBULATORY_CARE_PROVIDER_SITE_OTHER): Payer: 59 | Admitting: Licensed Clinical Social Worker

## 2018-05-09 DIAGNOSIS — F419 Anxiety disorder, unspecified: Secondary | ICD-10-CM | POA: Diagnosis not present

## 2018-05-10 ENCOUNTER — Encounter (HOSPITAL_COMMUNITY): Payer: Self-pay | Admitting: Licensed Clinical Social Worker

## 2018-05-10 NOTE — Progress Notes (Signed)
   THERAPIST PROGRESS NOTE  Session Time: 10:00 am-10:40 am   Participation Level: Active  Behavioral Response: CasualAlertAnxious  Type of Therapy: Individual Therapy  Treatment Goals addressed: Coping  Interventions: CBT and Solution Focused  Summary: Aaron Chambers is a 37 y.o. male who presents oriented x5 (person, place, situation, time, and object), casually dressed, appropriately groomed, average height, overweight and cooperative to address anxiety. Patient has a history of medical treatment including blood clots, and minimal history of mental health treatment including medication management. Patient denies suicidal and homicidal ideations. Patient denies psychosis including auditory and visual hallucinations. Patient denies substance abuse. Patient is at low risk for lethality.   Physically: Patient is active and works out.  Spiritually/values: No issues identified.  Relationships: Patient's marriage is going well. He is getting along with his family including his father. He noted that his father can get negative and be anxious which triggers patient's anxiety.  Emotionally/Mentally/Behavior: Patient's anxiety has been a little less. He is trying to be more open with how he is feeling and discussing situations that usually trigger anxiety. Patient was able to identify triggers for anxiety including feeling like he is not being heard, negativity, criticism, and other's anxiety. Patient is using self talk to help manage his anxiety by reminding himself that he can't change situations and is working to accept them. Patient noted that he is going to try write down when he has anxiety, the trigger, thoughts he is having and how he resolved the issues.   Patient engaged in session. He responded well to interventions. Patient continues to meet criteria for Anxiety disorder, unspecified type. He will continue in outpatient therapy due to being the least restrictive service to meet his  needs. Patient made minimal progress on his goals at this time.   Suicidal/Homicidal: Negativewithout intent/plan  Therapist Response: Therapist reviewed patient's recent thoughts and behaviors. Therapist utilized CBT to address anxiety. Therapist processed patient's thoughts to identify triggers for anxiety. Therapist assisted patient in identifying ways to identify his thoughts that trigger anxiety and ways to cope with anxiety.   Plan: Return again in 4 weeks.  Diagnosis: Axis I: Anxiety Disorder NOS    Axis II: No diagnosis    Bynum Bellows, LCSW 05/10/2018

## 2018-05-15 NOTE — Progress Notes (Deleted)
BH MD/PA/NP OP Progress Note  05/15/2018 4:05 PM Aaron Chambers  MRN:  [id number]  Chief Complaint:  HPI: *** Visit Diagnosis: No diagnosis found.  Past Psychiatric History: Please see initial evaluation for full details. I have reviewed the history. No updates at this time.     Past Medical History:  Past Medical History:  Diagnosis Date  . Allergic rhinitis   . Anxiety   . Arrhythmia   . Asthma   . Kidney stones   . Obesity     Past Surgical History:  Procedure Laterality Date  . CARDIAC ELECTROPHYSIOLOGY STUDY AND ABLATION    . VASCULAR SURGERY      Family Psychiatric History: Please see initial evaluation for full details. I have reviewed the history. No updates at this time.     Family History: No family history on file.  Social History:  Social History   Socioeconomic History  . Marital status: Married    Spouse name: Not on file  . Number of children: Not on file  . Years of education: Not on file  . Highest education level: Not on file  Occupational History  . Not on file  Social Needs  . Financial resource strain: Not on file  . Food insecurity:    Worry: Not on file    Inability: Not on file  . Transportation needs:    Medical: Not on file    Non-medical: Not on file  Tobacco Use  . Smoking status: Never Smoker  . Smokeless tobacco: Former Engineer, water and Sexual Activity  . Alcohol use: No  . Drug use: No  . Sexual activity: Not on file  Lifestyle  . Physical activity:    Days per week: Not on file    Minutes per session: Not on file  . Stress: Not on file  Relationships  . Social connections:    Talks on phone: Not on file    Gets together: Not on file    Attends religious service: Not on file    Active member of club or organization: Not on file    Attends meetings of clubs or organizations: Not on file    Relationship status: Not on file  Other Topics Concern  . Not on file  Social History Narrative  . Not on file     Allergies: No Known Allergies  Metabolic Disorder Labs: No results found for: HGBA1C, MPG No results found for: PROLACTIN Lab Results  Component Value Date   CHOL 169 06/09/2016   TRIG 81 06/09/2016   HDL 45 06/09/2016   CHOLHDL 3.8 06/09/2016   VLDL 25 03/23/2014   LDLCALC 108 (H) 06/09/2016   LDLCALC 82 04/29/2015   Lab Results  Component Value Date   TSH 1.240 11/03/2015   TSH 1.560 03/23/2014    Therapeutic Level Labs: No results found for: LITHIUM No results found for: VALPROATE No components found for:  CBMZ  Current Medications: Current Outpatient Medications  Medication Sig Dispense Refill  . albuterol (PROVENTIL HFA;VENTOLIN HFA) 108 (90 Base) MCG/ACT inhaler Inhale 2 puffs into the lungs every 6 (six) hours as needed for wheezing or shortness of breath. 1 Inhaler 2  . apixaban (ELIQUIS) 5 MG TABS tablet Take 2 tablets (10 mg total) by mouth 2 (two) times daily. Then start 1 tablet (5 mg) two times daily on 04/02/18 60 tablet 0  . clonazePAM (KLONOPIN) 0.5 MG tablet TAKE (1) TABLET BY MOUTH UP TO BID PRN (Patient taking differently: Take  0.5 mg by mouth 2 (two) times daily as needed for anxiety. ) 30 tablet 5  . escitalopram (LEXAPRO) 20 MG tablet Take 1 tablet (20 mg total) by mouth daily. 30 tablet 5  . Multiple Vitamin (MULTIVITAMIN WITH MINERALS) TABS tablet Take 1 tablet by mouth daily.     No current facility-administered medications for this visit.      Musculoskeletal: Strength & Muscle Tone: within normal limits Gait & Station: normal Patient leans: N/A  Psychiatric Specialty Exam: ROS  There were no vitals taken for this visit.There is no height or weight on file to calculate BMI.  General Appearance: Fairly Groomed  Eye Contact:  Good  Speech:  Clear and Coherent  Volume:  Normal  Mood:  {BHH MOOD:22306}  Affect:  {Affect (PAA):22687}  Thought Process:  Coherent  Orientation:  Full (Time, Place, and Person)  Thought Content: Logical    Suicidal Thoughts:  {ST/HT (PAA):22692}  Homicidal Thoughts:  {ST/HT (PAA):22692}  Memory:  Immediate;   Good  Judgement:  {Judgement (PAA):22694}  Insight:  {Insight (PAA):22695}  Psychomotor Activity:  Normal  Concentration:  Concentration: Good and Attention Span: Good  Recall:  Good  Fund of Knowledge: Good  Language: Good  Akathisia:  No  Handed:  Right  AIMS (if indicated): not done  Assets:  Communication Skills Desire for Improvement  ADL's:  Intact  Cognition: WNL  Sleep:  {BHH GOOD/FAIR/POOR:22877}   Screenings: GAD-7     Office Visit from 01/18/2018 in [facility] Office Visit from 01/07/2018 in [facility] Office Visit from 07/06/2017 in [facility]  Total GAD-7 Score  11  18  11     PHQ2-9     Office Visit from 01/30/2018 in [facility] Office Visit from 01/18/2018 in [facility] Office Visit from 01/07/2018 in [facility] Office Visit from 07/06/2017 in [facility]  PHQ-2 Total Score  0  1  4  1   PHQ-9 Total Score  2  3  15  5        Assessment and Plan:  Aaron Chambers is a 37 y.o. year old male with a history of anxiety, ,hypertension, OSA , who presents for follow up appointment for No diagnosis found.  # Unspecified anxiety disorder  He reports occasional intense anxiety was social anxiety since the last appointment.  Will continue Lexapro to target anxiety.  Will continue clonazepam as needed for anxiety .  Discussed risk of oversedation and dependence.  He will greatly benefit from CBT, will make referral.  Noted that although he did have SI, it was likely secondary to Clomid, given it resolved after he discontinued this medication.  Will continue to monitor.    Plan 1. Continue lexapro 20 mg daily  2. Return to clinic in one month for 15 mins 3. Referral to therapy - on clonazepam 0.5 mg twice a day as needed for anxiety   The patient demonstrates  the following risk factors for suicide: Chronic risk factors for suicide include:psychiatric disorder ofanxiety. Acute risk factorsfor suicide include: N/A. Protective factorsfor this patient include: positive social support, responsibility to others (children, family), coping skills and hope for the future. Considering these factors, the overall suicide risk at this point appears to below. Patientisappropriate for outpatient follow up. Although he does have access to guns, those are locked. He also asked his wife to monitor him given his recent episode.   Neysa Hotter, MD 05/15/2018, 4:05 PM

## 2018-05-21 ENCOUNTER — Ambulatory Visit (HOSPITAL_COMMUNITY): Payer: 59 | Admitting: Psychiatry

## 2018-06-26 NOTE — Telephone Encounter (Signed)
error 

## 2018-07-16 ENCOUNTER — Ambulatory Visit (HOSPITAL_COMMUNITY): Payer: 59 | Admitting: Licensed Clinical Social Worker

## 2018-07-31 ENCOUNTER — Encounter: Payer: Self-pay | Admitting: Family Medicine

## 2018-07-31 ENCOUNTER — Ambulatory Visit: Payer: Managed Care, Other (non HMO) | Admitting: Family Medicine

## 2018-07-31 VITALS — HR 80 | Temp 98.4°F | Ht 76.0 in | Wt 394.0 lb

## 2018-07-31 DIAGNOSIS — J019 Acute sinusitis, unspecified: Secondary | ICD-10-CM | POA: Diagnosis not present

## 2018-07-31 MED ORDER — DOXYCYCLINE HYCLATE 100 MG PO TABS: 100.0000 mg | ORAL_TABLET | Freq: Two times a day (BID) | ORAL | 0 refills | Status: DC

## 2018-07-31 NOTE — Progress Notes (Signed)
   Subjective:    Patient ID: Aaron Chambers, male    DOB: [birth year], 38 y.o.   MRN: [id number]  Sinus Problem  This is a new problem. The current episode started in the past 7 days. Associated symptoms include congestion, coughing, ear pain and headaches. (Wheezing) Treatments tried: mucinex and tylenol cold and sinus.   5 days ago started with cough, worsening. Cough productive of yellow mucous. Congested. Reports uses sinex nasal spray daily for long time. Reports sinus pressure and tenderness to his head. Denies shortness of breath, reports slight wheeze. Has tried albuterol inhaler and states didn't do much. No body aches.   Reports fever today of 100.8, took tylenol. No known fever prior.   No known sick contacts  PE back in September with DVT to left leg, on eliquis, has followed up with [facility] doctor in [place], goes back in March to recheck. Has not had f/u with PCP as recommended at discharge.    Review of Systems  HENT: Positive for congestion and ear pain.   Respiratory: Positive for cough.   Neurological: Positive for headaches.       Objective:   Physical Exam Vitals signs and nursing note reviewed.  Constitutional:      General: He is not in acute distress.    Appearance: Normal appearance. He is not toxic-appearing.  HENT:     Head: Normocephalic and atraumatic.     Right Ear: Tympanic membrane normal.     Ears:     Comments: Left TM with slight erythema    Nose: Congestion present.     Comments: No sinus tenderness    Mouth/Throat:     Mouth: Mucous membranes are moist.     Pharynx: Oropharynx is clear.  Eyes:     General:        Right eye: No discharge.        Left eye: No discharge.  Neck:     Musculoskeletal: Neck supple. No neck rigidity.  Cardiovascular:     Rate and Rhythm: Normal rate and regular rhythm.     Heart sounds: Normal heart sounds.  Pulmonary:     Effort: Pulmonary effort is normal. No respiratory distress.     Breath  sounds: Normal breath sounds. No wheezing or rales.  Lymphadenopathy:     Cervical: No cervical adenopathy.  Skin:    General: Skin is warm and dry.  Neurological:     Mental Status: He is alert and oriented to person, place, and time.  Psychiatric:        Mood and Affect: Mood normal.        Assessment & Plan:  Acute rhinosinusitis  Discussed likely post-viral etiology. Will treat with antibiotic.  Symptomatic care discussed.  Warning signs discussed.  Recommended he stop taking Synex nasal spray daily.  Recommend he follow-up in the next 1 to 2 weeks with Dr. Brett Canales for follow-up on his hospitalization for PE back in September.  Follow-up if symptoms worsen or fail to improve.  Dr. Lubertha South was consulted on this case and is in agreement with the above treatment plan.

## 2018-08-05 ENCOUNTER — Other Ambulatory Visit: Payer: Self-pay | Admitting: Family Medicine

## 2018-08-05 NOTE — Telephone Encounter (Signed)
Ok times one 

## 2018-08-14 ENCOUNTER — Ambulatory Visit: Payer: Managed Care, Other (non HMO) | Admitting: Family Medicine

## 2018-08-27 ENCOUNTER — Ambulatory Visit: Payer: Managed Care, Other (non HMO) | Admitting: Family Medicine

## 2018-08-27 ENCOUNTER — Encounter: Payer: Self-pay | Admitting: Family Medicine

## 2018-08-27 ENCOUNTER — Telehealth: Payer: Self-pay

## 2018-08-27 VITALS — BP 130/90 | Ht 76.0 in | Wt 392.1 lb

## 2018-08-27 DIAGNOSIS — I2699 Other pulmonary embolism without acute cor pulmonale: Secondary | ICD-10-CM | POA: Diagnosis not present

## 2018-08-27 DIAGNOSIS — F419 Anxiety disorder, unspecified: Secondary | ICD-10-CM | POA: Diagnosis not present

## 2018-08-27 DIAGNOSIS — G4733 Obstructive sleep apnea (adult) (pediatric): Secondary | ICD-10-CM

## 2018-08-27 MED ORDER — APIXABAN 5 MG PO TABS: 10.0000 mg | ORAL_TABLET | Freq: Two times a day (BID) | ORAL | 0 refills | Status: DC

## 2018-08-27 MED ORDER — ALBUTEROL SULFATE HFA 108 (90 BASE) MCG/ACT IN AERS: 2.0000 | INHALATION_SPRAY | Freq: Four times a day (QID) | RESPIRATORY_TRACT | 2 refills | Status: DC | PRN

## 2018-08-27 MED ORDER — CLONAZEPAM 0.5 MG PO TABS: ORAL_TABLET | ORAL | 5 refills | Status: DC

## 2018-08-27 MED ORDER — ESCITALOPRAM OXALATE 20 MG PO TABS: 20.0000 mg | ORAL_TABLET | Freq: Every day | ORAL | 5 refills | Status: DC

## 2018-08-27 NOTE — Progress Notes (Signed)
   Subjective:    Patient ID: Aaron Chambers, male    DOB: 02/02/1981, 38 y.o.   MRN: [id number]  HPI  Patient is here today to do a follow up from hospitalization on 03/24/2018 due to pulmonary embolism,that the pt states was due to a fertilization medication he was taking to help with he and his wife getting pregnant.  He was started on Eliqus 5 mg.  Patient claims he has maintained Eliquis, though no refills evident on the medication was from the hospitalization.  He also states he needs his anxiety medications refilled today.  Patient notes Lexapro continues to manage his symptoms well.  Compliant with medications.  Occasional use of benzodiazepine also.  Has had no further symtoms of pulm embolus  Pt had hx of superficial thrombolphlebitis in the past, in fact has had multiple  The vein speciciaist has been seeing yrly with hx of recurrent superficial thrombophlebitis  Still uses the CPAP for sleep.  Helping him tremendously.  Ongoing frustration with his weight.  Saw the bariatric folks.  Has elected not to go that direction.  Requests a dietitian  Review of Systems No headache, no major weight loss or weight gain, no chest pain no back pain abdominal pain no change in bowel habits complete ROS otherwise negative     Objective:   Physical Exam  Alert and oriented, vitals reviewed and stable, NAD ENT-TM's and ext canals WNL bilat via otoscopic exam Soft palate, tonsils and post pharynx WNL via oropharyngeal exam Neck-symmetric, no masses; thyroid nonpalpable and nontender Pulmonary-no tachypnea or accessory muscle use; Clear without wheezes via auscultation Card--no abnrml murmurs, rhythm reg and rate WNL Carotid pulses symmetric, without bruits       Assessment & Plan:  Impression 1 generalized anxiety disorder.  Clinically stable.  To maintain same meds  2.  Morbid obesity.  Ongoing concern.  Rejects bariatric referral.  Work on dietary referral  3.  Sleep apnea  stable maintain same treatment  4.  Pulmonary embolus.  Complicated by abnormalities in hypercoagulable screen.  This could have been compromised by patient taking other hormones for fertility concerns.  Also complicated by multiple superficial thrombophlebitis attacks in the past.  Which now are considered more ominous in terms of long-term risk of recurrent pulmonary emboli.  Discussed with patient.  Referral to capable of hematologist to sort through all this to decide duration of Eliquis.  Dietary referral.  Meds refilled.  Recheck in 6 months [facility]

## 2018-08-27 NOTE — Telephone Encounter (Signed)
Trip with [facility] is calling regarding this pt's Eliquis. They state the pt has not picked up at there pharmacy since October 2019.  They wanted to know if they needed to jump start the pt again, or still take one po bid.  I spoke with the patient and he states he has been taking one bid on going.   He did get several refills at [facility] and this would explain why not been picked up from [facility] since Oct 2019.  So call Trip back at [facility] and tell him to fill at 1 bid # 60 with no refills?

## 2018-08-27 NOTE — Telephone Encounter (Signed)
I called and spoke with Trip Pharmacist at [facility] he is aware of all.

## 2018-08-27 NOTE — Telephone Encounter (Signed)
Yes,

## 2018-09-02 ENCOUNTER — Encounter: Payer: Self-pay | Admitting: Family Medicine

## 2018-09-04 ENCOUNTER — Inpatient Hospital Stay (HOSPITAL_COMMUNITY): Payer: Managed Care, Other (non HMO)

## 2018-09-04 ENCOUNTER — Inpatient Hospital Stay (HOSPITAL_COMMUNITY): Payer: Managed Care, Other (non HMO) | Attending: Hematology | Admitting: Hematology

## 2018-09-04 ENCOUNTER — Encounter (HOSPITAL_COMMUNITY): Payer: Self-pay | Admitting: Hematology

## 2018-09-04 VITALS — BP 136/84 | HR 78 | Temp 97.8°F | Resp 18 | Wt 395.0 lb

## 2018-09-04 DIAGNOSIS — I809 Phlebitis and thrombophlebitis of unspecified site: Secondary | ICD-10-CM | POA: Diagnosis not present

## 2018-09-04 DIAGNOSIS — I2699 Other pulmonary embolism without acute cor pulmonale: Secondary | ICD-10-CM | POA: Insufficient documentation

## 2018-09-04 DIAGNOSIS — Z801 Family history of malignant neoplasm of trachea, bronchus and lung: Secondary | ICD-10-CM | POA: Diagnosis not present

## 2018-09-04 DIAGNOSIS — D72829 Elevated white blood cell count, unspecified: Secondary | ICD-10-CM

## 2018-09-04 DIAGNOSIS — D696 Thrombocytopenia, unspecified: Secondary | ICD-10-CM

## 2018-09-04 DIAGNOSIS — R911 Solitary pulmonary nodule: Secondary | ICD-10-CM | POA: Diagnosis not present

## 2018-09-04 DIAGNOSIS — I82432 Acute embolism and thrombosis of left popliteal vein: Secondary | ICD-10-CM | POA: Diagnosis not present

## 2018-09-04 DIAGNOSIS — Z7901 Long term (current) use of anticoagulants: Secondary | ICD-10-CM | POA: Diagnosis not present

## 2018-09-04 LAB — D-DIMER, QUANTITATIVE (NOT AT ARMC)

## 2018-09-04 NOTE — Progress Notes (Signed)
CONSULT NOTE  Patient Care Team: Merlyn Albert, MD as PCP - General (Family Medicine)  CHIEF COMPLAINTS/PURPOSE OF CONSULTATION: Acute Pulmonary Embolism  HISTORY OF PRESENTING ILLNESS:  Aaron Chambers 38 y.o. male is here because of a new acute pulmonary embolism. He reports having multiple episodes of superficial thrombophlebitis starting in 2010. He was never placed on any blood thinners or antiplatelet agents. Him and his wife were activity trying to conceive and having problems. They started working out at the gym 5 days a week. A few months later he started anti-estrogen fertility hormones Clomid and Arimidex. He started experiencing depression from the medications and the decreased the medicine. He had the DVT about 4-5 months after starting these medications. 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 He does get up and take breaks. He denies smoking but does chew tobacco. Denies alcohol. Denies any nausea, vomiting, or diarrhea. Denies any new pains. Had not noticed any recent bleeding such as epistaxis, hematuria or hematochezia. Denies recent chest pain on exertion, shortness of breath on minimal exertion, pre-syncopal episodes, or palpitations. Denies any numbness or tingling in hands or feet. Denies any recent fevers, infections, or recent hospitalizations. Patient reports appetite at 100% and energy level at 100%. He lives at home with his wife and is full functioning and able to perform all his own ADLs and activities.  He denies any family history of blood clots or any other disorders. He dose have a maternal grandfather that had lung cancer.     MEDICAL HISTORY:  Past Medical History:  Diagnosis Date  . Allergic rhinitis   . Anxiety   . Arrhythmia   . Asthma   . Kidney stones   . Obesity     SURGICAL HISTORY: Past Surgical History:  Procedure Laterality Date  . CARDIAC ELECTROPHYSIOLOGY STUDY AND ABLATION    . VASCULAR  SURGERY      SOCIAL HISTORY: Social History   Socioeconomic History  . Marital status: Married    Spouse name: Not on file  . Number of children: Not on file  . Years of education: Not on file  . Highest education level: Not on file  Occupational History  . Not on file  Social Needs  . Financial resource strain: Not on file  . Food insecurity:    Worry: Not on file    Inability: Not on file  . Transportation needs:    Medical: Not on file    Non-medical: Not on file  Tobacco Use  . Smoking status: Never Smoker  . Smokeless tobacco: Former Engineer, water and Sexual Activity  . Alcohol use: No  . Drug use: No  . Sexual activity: Not on file  Lifestyle  . Physical activity:    Days per week: Not on file    Minutes per session: Not on file  . Stress: Not on file  Relationships  . Social connections:    Talks on phone: Not on file    Gets together: Not on file    Attends religious service: Not on file    Active member of club or organization: Not on file    Attends meetings of clubs or organizations: Not on file    Relationship status: Not on file  . Intimate partner violence:    Fear of current or ex partner: Not on file    Emotionally abused: Not on file    Physically abused: Not  on file    Forced sexual activity: Not on file  Other Topics Concern  . Not on file  Social History Narrative  . Not on file    FAMILY HISTORY: History reviewed. No pertinent family history.  ALLERGIES:  has No Known Allergies.  MEDICATIONS:  Current Outpatient Medications  Medication Sig Dispense Refill  . albuterol (PROVENTIL HFA;VENTOLIN HFA) 108 (90 Base) MCG/ACT inhaler Inhale 2 puffs into the lungs every 6 (six) hours as needed for wheezing or shortness of breath. 1 Inhaler 2  . apixaban (ELIQUIS) 5 MG TABS tablet Take 2 tablets (10 mg total) by mouth 2 (two) times daily. Then start 1 tablet (5 mg) two times daily on 04/02/18 60 tablet 0  . clonazePAM (KLONOPIN) 0.5 MG  tablet TAKE 1 TABLET BY MOUTH UP TO TWICE DAILY AS NEEDED. MUST LAST 30 DAYS. 30 tablet 5  . escitalopram (LEXAPRO) 20 MG tablet Take 1 tablet (20 mg total) by mouth daily. 30 tablet 5  . Multiple Vitamin (MULTIVITAMIN WITH MINERALS) TABS tablet Take 1 tablet by mouth daily.     No current facility-administered medications for this visit.     REVIEW OF SYSTEMS:   Constitutional: Denies fevers, chills or abnormal night sweats Eyes: Denies blurriness of vision, double vision or watery eyes Ears, nose, mouth, throat, and face: Denies mucositis or sore throat Respiratory: Denies cough, dyspnea or wheezes Cardiovascular: Denies palpitation, chest discomfort or lower extremity swelling Gastrointestinal:  Denies nausea, heartburn or change in bowel habits Skin: Denies abnormal skin rashes Lymphatics: Denies new lymphadenopathy or easy bruising Neurological:Denies numbness, tingling or new weaknesses Behavioral/Psych: Mood is stable, no new changes  All other systems were reviewed with the patient and are negative.  PHYSICAL EXAMINATION: ECOG PERFORMANCE STATUS: 1 - Symptomatic but completely ambulatory  Vitals:   09/04/18 1325  BP: 136/84  Pulse: 78  Resp: 18  Temp: 97.8 F (36.6 C)  SpO2: 96%   Filed Weights   09/04/18 1325  Weight: (!) 395 lb (179.2 kg)    GENERAL:alert, no distress and comfortable SKIN: skin color, texture, turgor are normal, no rashes or significant lesions EYES: normal, conjunctiva are pink and non-injected, sclera clear OROPHARYNX:no exudate, no erythema and lips, buccal mucosa, and tongue normal  NECK: supple, thyroid normal size, non-tender, without nodularity LYMPH:  no palpable lymphadenopathy in the cervical, axillary or inguinal LUNGS: clear to auscultation and percussion with normal breathing effort HEART: regular rate & rhythm and no murmurs and no lower extremity edema ABDOMEN:abdomen soft, non-tender and normal bowel sounds Musculoskeletal:no  cyanosis of digits and no clubbing  PSYCH: alert & oriented x 3 with fluent speech NEURO: no focal motor/sensory deficits Chronic venous stasis changes of bilateral lower extremities present.  LABORATORY DATA:  I have reviewed the data as listed  RADIOGRAPHIC STUDIES: I have personally reviewed the radiological images as listed and agreed with the findings in the report.  I have reviewed Mathis Bud, NP's note and agree with the documentation.  I personally performed a face-to-face visit, made revisions and my assessment and plan is as follows.  ASSESSMENT & PLAN:  Pulmonary embolism (HCC) 1.  Weakly provoked left leg DVT and PE: -Presentation to the ER with shortness of breath on exertion.  Also noticed pain in the left calf few days prior. -CT chest PE protocol on 03/24/2018 shows acute bilateral pulmonary emboli although limited contrast opacification in the pulmonary arterial system.  4 mm left upper lobe lung nodule. - Ultrasound Doppler on 03/25/2018  shows occlusive DVT of the left popliteal vein. -Patient was reportedly started on Clomid and Arimidex around April 2019. -Patient reports multiple episodes of superficial phlebitis in both legs in 2009-2010. - He had some procedures done on the legs for varicose veins. - He has been on Eliquis and is tolerating it very well. -Hypercoagulable work-up at the time of DVT diagnosis showed normal factor V Leiden and prothrombin gene mutations.  Protein C, protein S and Antithrombin III levels were also grossly within normal limits.  Lupus anticoagulant, anticardiolipin and anti-beta-2 glycoprotein antibodies were negative. - I have reviewed his CBC over several years.  His white count has been elevated since August 2018, mostly neutrophils. -I have recommended testing for Jak 2 mutation as myeloproliferative disorders can cause thrombosis.  We will also check BCR/ABL by FISH.  We will also send a d-dimer today. - I will see him back in 2 to 3  weeks to discuss the results.  If the above tests are negative, he can come off of anticoagulation.  Patient already discontinued Clomid and Arimidex in September 2019.     All questions were answered. The patient knows to call the clinic with any problems, questions or concerns.      Doreatha Massed, MD 09/04/18 5:44 PM

## 2018-09-04 NOTE — Assessment & Plan Note (Signed)
1.  Weakly provoked left leg DVT and PE: -Presentation to the ER with shortness of breath on exertion.  Also noticed pain in the left calf few days prior. -CT chest PE protocol on 03/24/2018 shows acute bilateral pulmonary emboli although limited contrast opacification in the pulmonary arterial system.  4 mm left upper lobe lung nodule. - Ultrasound Doppler on 03/25/2018 shows occlusive DVT of the left popliteal vein. -Patient was reportedly started on Clomid and Arimidex around April 2019. -Patient reports multiple episodes of superficial phlebitis in both legs in 2009-2010. - He had some procedures done on the legs for varicose veins. - He has been on Eliquis and is tolerating it very well. -Hypercoagulable work-up at the time of DVT diagnosis showed normal factor V Leiden and prothrombin gene mutations.  Protein C, protein S and Antithrombin III levels were also grossly within normal limits.  Lupus anticoagulant, anticardiolipin and anti-beta-2 glycoprotein antibodies were negative. - I have reviewed his CBC over several years.  His white count has been elevated since August 2018, mostly neutrophils. -I have recommended testing for Jak 2 mutation as myeloproliferative disorders can cause thrombosis.  We will also check BCR/ABL by FISH.  We will also send a d-dimer today. - I will see him back in 2 to 3 weeks to discuss the results.  If the above tests are negative, he can come off of anticoagulation.  Patient already discontinued Clomid and Arimidex in September 2019.

## 2018-09-04 NOTE — Patient Instructions (Signed)
[facility] at [facility] Discharge Instructions     Thank you for choosing  Cancer Center at [facility] to provide your oncology and hematology care.  To afford each patient quality time with our provider, please arrive at least 15 minutes before your scheduled appointment time.   If you have a lab appointment with the Cancer Center please come in thru the  Main Entrance and check in at the main information desk  You need to re-schedule your appointment should you arrive 10 or more minutes late.  We strive to give you quality time with our providers, and arriving late affects you and other patients whose appointments are after yours.  Also, if you no show three or more times for appointments you may be dismissed from the clinic at the providers discretion.     Again, thank you for choosing [facility].  Our hope is that these requests will decrease the amount of time that you wait before being seen by our physicians.       _____________________________________________________________  Should you have questions after your visit to [facility], please contact our office at [phone] between the hours of 8:00 a.m. and 4:30 p.m.  Voicemails left after 4:00 p.m. will not be returned until the following business day.  For prescription refill requests, have your pharmacy contact our office and allow 72 hours.    Cancer Center Support Programs:   > Cancer Support Group  2nd Tuesday of the month 1pm-2pm, Journey Room    

## 2018-09-05 LAB — VON WILLEBRAND ANTIGEN: VON WILLEBRAND ANTIGEN, PLASMA: 264 % — AB (ref 50–200)

## 2018-09-09 LAB — JAK2 GENOTYPR

## 2018-09-12 LAB — BCR-ABL1 FISH
CELLS ANALYZED: 200
CELLS COUNTED: 200

## 2018-09-18 ENCOUNTER — Inpatient Hospital Stay (HOSPITAL_COMMUNITY): Payer: Managed Care, Other (non HMO) | Attending: Hematology | Admitting: Hematology

## 2018-09-18 ENCOUNTER — Encounter (HOSPITAL_COMMUNITY): Payer: Self-pay | Admitting: Hematology

## 2018-09-18 ENCOUNTER — Other Ambulatory Visit: Payer: Self-pay

## 2018-09-18 VITALS — BP 130/81 | HR 76 | Temp 99.1°F | Resp 18 | Wt 399.4 lb

## 2018-09-18 DIAGNOSIS — I2699 Other pulmonary embolism without acute cor pulmonale: Secondary | ICD-10-CM | POA: Diagnosis present

## 2018-09-18 DIAGNOSIS — R911 Solitary pulmonary nodule: Secondary | ICD-10-CM

## 2018-09-18 DIAGNOSIS — I82432 Acute embolism and thrombosis of left popliteal vein: Secondary | ICD-10-CM

## 2018-09-18 DIAGNOSIS — Z23 Encounter for immunization: Secondary | ICD-10-CM | POA: Diagnosis not present

## 2018-09-18 MED ORDER — INFLUENZA VAC SPLIT QUAD 0.5 ML IM SUSY
PREFILLED_SYRINGE | INTRAMUSCULAR | Status: AC
  Filled 2018-09-18: qty 0.5

## 2018-09-18 MED ORDER — APIXABAN 2.5 MG PO TABS: 2.5000 mg | ORAL_TABLET | Freq: Two times a day (BID) | ORAL | 6 refills | Status: DC

## 2018-09-18 MED ORDER — INFLUENZA VAC SPLIT QUAD 0.5 ML IM SUSY
0.5000 mL | PREFILLED_SYRINGE | Freq: Once | INTRAMUSCULAR | Status: AC
  Administered 2018-09-18: 0.5 mL via INTRAMUSCULAR

## 2018-09-18 NOTE — Progress Notes (Signed)
[facility] [address], [place] [address]   CLINIC:  Medical Oncology/Hematology  PCP:  Aaron Albert, MD [address] [place] [address] [phone]   REASON FOR VISIT:  Follow-up for Acute Pulmonary Embolism  INTERVAL HISTORY:  [name] 38 y.o. male returns for routine follow-up on Acute Pulmonary Embolism. He is here today by himself. He states that he's been doing great since his last visit. He states that he stays pretty active, always doing something. He states that he and his wife are working on their weight and trying to get healthy.  Denies any nausea, vomiting, or diarrhea. Denies any new pains. Had not noticed any recent bleeding such as epistaxis, hematuria or hematochezia. Denies recent chest pain on exertion, shortness of breath on minimal exertion, pre-syncopal episodes, or palpitations. Denies any numbness or tingling in hands or feet. Denies any recent fevers, infections, or recent hospitalizations. Patient reports appetite at 100% and energy level at 100%.   REVIEW OF SYSTEMS:  Review of Systems  Neurological: Positive for numbness.  Psychiatric/Behavioral: The patient is nervous/anxious.   All other systems reviewed and are negative.    PAST MEDICAL/SURGICAL HISTORY:  Past Medical History:  Diagnosis Date  . Allergic rhinitis   . Anxiety   . Arrhythmia   . Asthma   . Kidney stones   . Obesity    Past Surgical History:  Procedure Laterality Date  . CARDIAC ELECTROPHYSIOLOGY STUDY AND ABLATION    . VASCULAR SURGERY       SOCIAL HISTORY:  Social History   Socioeconomic History  . Marital status: Married    Spouse name: Not on file  . Number of children: Not on file  . Years of education: Not on file  . Highest education level: Not on file  Occupational History  . Not on file  Social Needs  . Financial resource strain: Not on file  . Food insecurity:    Worry: Not on file    Inability: Not on  file  . Transportation needs:    Medical: Not on file    Non-medical: Not on file  Tobacco Use  . Smoking status: Never Smoker  . Smokeless tobacco: Former Engineer, water and Sexual Activity  . Alcohol use: No  . Drug use: No  . Sexual activity: Not on file  Lifestyle  . Physical activity:    Days per week: Not on file    Minutes per session: Not on file  . Stress: Not on file  Relationships  . Social connections:    Talks on phone: Not on file    Gets together: Not on file    Attends religious service: Not on file    Active member of club or organization: Not on file    Attends meetings of clubs or organizations: Not on file    Relationship status: Not on file  . Intimate partner violence:    Fear of current or ex partner: Not on file    Emotionally abused: Not on file    Physically abused: Not on file    Forced sexual activity: Not on file  Other Topics Concern  . Not on file  Social History Narrative  . Not on file    FAMILY HISTORY:  History reviewed. No pertinent family history.  CURRENT MEDICATIONS:  Outpatient Encounter Medications as of 09/18/2018  Medication Sig  . albuterol (PROVENTIL HFA;VENTOLIN HFA) 108 (90 Base) MCG/ACT inhaler Inhale 2 puffs  into the lungs every 6 (six) hours as needed for wheezing or shortness of breath.  Marland Kitchen apixaban (ELIQUIS) 2.5 MG TABS tablet Take 1 tablet (2.5 mg total) by mouth 2 (two) times daily.  . clonazePAM (KLONOPIN) 0.5 MG tablet TAKE 1 TABLET BY MOUTH UP TO TWICE DAILY AS NEEDED. MUST LAST 30 DAYS.  Marland Kitchen escitalopram (LEXAPRO) 20 MG tablet Take 1 tablet (20 mg total) by mouth daily.  . Multiple Vitamin (MULTIVITAMIN WITH MINERALS) TABS tablet Take 1 tablet by mouth daily.  . [DISCONTINUED] apixaban (ELIQUIS) 5 MG TABS tablet Take 2 tablets (10 mg total) by mouth 2 (two) times daily. Then start 1 tablet (5 mg) two times daily on 04/02/18 (Patient taking differently: Take 5 mg by mouth 2 (two) times daily. )  . [EXPIRED] Influenza  vac split quadrivalent PF (FLUARIX) injection 0.5 mL    No facility-administered encounter medications on file as of 09/18/2018.     ALLERGIES:  No Known Allergies   PHYSICAL EXAM:  ECOG Performance status: 0  Vitals:   09/18/18 1441 09/18/18 1500  BP: (!) 157/98 130/81  Pulse: 76   Resp: 18   Temp: 99.1 F (37.3 C)   SpO2: 97%    Filed Weights   09/18/18 1441  Weight: (!) 399 lb 6.4 oz (181.2 kg)    Physical Exam Constitutional:      Appearance: Normal appearance.  Neurological:     General: No focal deficit present.     Mental Status: He is alert and oriented to person, place, and time.  Psychiatric:        Mood and Affect: Mood normal.        Behavior: Behavior normal.      LABORATORY DATA:  I have reviewed the labs as listed.  CBC    Component Value Date/Time   WBC 11.4 (H) 03/26/2018 0437   RBC 4.91 03/26/2018 0437   HGB 13.7 03/26/2018 0437   HGB 14.3 11/03/2015 1540   HCT 41.9 03/26/2018 0437   HCT 43.5 11/03/2015 1540   PLT 150 03/26/2018 0437   PLT 208 11/03/2015 1540   MCV 85.3 03/26/2018 0437   MCV 86 11/03/2015 1540   MCH 27.9 03/26/2018 0437   MCHC 32.7 03/26/2018 0437   RDW 14.0 03/26/2018 0437   RDW 13.7 11/03/2015 1540   LYMPHSABS 3.3 03/25/2018 0100   LYMPHSABS 2.2 11/03/2015 1540   MONOABS 1.2 (H) 03/25/2018 0100   EOSABS 0.3 03/25/2018 0100   EOSABS 0.2 11/03/2015 1540   BASOSABS 0.0 03/25/2018 0100   BASOSABS 0.0 11/03/2015 1540   CMP Latest Ref Rng & Units 03/26/2018 03/25/2018 03/24/2018  Glucose 70 - 99 mg/dL 215(U) 727(M) 184(Q)  BUN 6 - 20 mg/dL 10 15 16   Creatinine 0.61 - 1.24 mg/dL 5.92 7.63 9.43  Sodium 135 - 145 mmol/L 141 138 141  Potassium 3.5 - 5.1 mmol/L 4.0 4.1 4.1  Chloride 98 - 111 mmol/L 107 106 105  CO2 22 - 32 mmol/L 25 26 -  Calcium 8.9 - 10.3 mg/dL 8.9 9.2 -  Total Protein 6.5 - 8.1 g/dL - 7.5 -  Total Bilirubin 0.3 - 1.2 mg/dL - 0.9 -  Alkaline Phos 38 - 126 U/L - 56 -  AST 15 - 41 U/L - 32 -  ALT 0 -  44 U/L - 42 -        I have independently reviewed the scans and discussed with the patient.   I have reviewed Willy Eddy, LPN note  and agree with the documentation.  I personally performed a face-to-face visit, made revisions and my assessment and plan is as follows.    ASSESSMENT & PLAN:   Pulmonary embolism (HCC) 1.  Weakly provoked left leg DVT and PE: -Presentation to the ER with shortness of breath on exertion.  Also noticed pain in the left calf few days prior. -CT chest PE protocol on 03/24/2018 shows acute bilateral pulmonary emboli although limited contrast opacification in the pulmonary arterial system.  4 mm left upper lobe lung nodule. - Ultrasound Doppler on 03/25/2018 shows occlusive DVT of the left popliteal vein. -Patient was reportedly started on Clomid and Arimidex around April 2019. -Patient reports multiple episodes of superficial phlebitis in both legs in 2009-2010. - He had some procedures done on the legs for varicose veins. - He has been on Eliquis and is tolerating it very well. -Hypercoagulable work-up at the time of DVT diagnosis showed normal factor V Leiden and prothrombin gene mutations.  Protein C, protein S and Antithrombin III levels were also grossly within normal limits.  Lupus anticoagulant, anticardiolipin and anti-beta-2 glycoprotein antibodies were negative. - As his CBC was elevated for several years, I have done work-up for myeloproliferative disorders. -BCR/ABL by FISH and Jak 2 V6 97F mutation testing was negative.  Latest d-dimer was normal. - He has completed 6 months of anticoagulation with Eliquis.  He has not had any problems with bleeding.  He has about 7% risk of recurrence of DVT in the first year based on his obesity (metabolic syndrome). -I have recommended reduced intensity anticoagulation with Eliquis 2.5 mg twice daily.  He is agreeable to this option.  I will see him back in 6 months for follow-up.      Orders placed this  encounter:  No orders of the defined types were placed in this encounter.     Doreatha Massed, MD [facility] [phone]

## 2018-09-18 NOTE — Assessment & Plan Note (Signed)
1.  Weakly provoked left leg DVT and PE: -Presentation to the ER with shortness of breath on exertion.  Also noticed pain in the left calf few days prior. -CT chest PE protocol on 03/24/2018 shows acute bilateral pulmonary emboli although limited contrast opacification in the pulmonary arterial system.  4 mm left upper lobe lung nodule. - Ultrasound Doppler on 03/25/2018 shows occlusive DVT of the left popliteal vein. -Patient was reportedly started on Clomid and Arimidex around April 2019. -Patient reports multiple episodes of superficial phlebitis in both legs in 2009-2010. - He had some procedures done on the legs for varicose veins. - He has been on Eliquis and is tolerating it very well. -Hypercoagulable work-up at the time of DVT diagnosis showed normal factor V Leiden and prothrombin gene mutations.  Protein C, protein S and Antithrombin III levels were also grossly within normal limits.  Lupus anticoagulant, anticardiolipin and anti-beta-2 glycoprotein antibodies were negative. - As his CBC was elevated for several years, I have done work-up for myeloproliferative disorders. -BCR/ABL by FISH and Jak 2 V6 46F mutation testing was negative.  Latest d-dimer was normal. - He has completed 6 months of anticoagulation with Eliquis.  He has not had any problems with bleeding.  He has about 7% risk of recurrence of DVT in the first year based on his obesity (metabolic syndrome). -I have recommended reduced intensity anticoagulation with Eliquis 2.5 mg twice daily.  He is agreeable to this option.  I will see him back in 6 months for follow-up.

## 2018-09-18 NOTE — Patient Instructions (Addendum)
[facility] at [facility] Discharge Instructions  You were seen today by Dr. Ellin Saba. He went over your recent test results. He wants you to take half of your current dose of blood thinner. He will see you back in 6 months for labs and follow up.    Thank you for choosing [facility] at [facility] to provide your oncology and hematology care.  To afford each patient quality time with our provider, please arrive at least 15 minutes before your scheduled appointment time.   If you have a lab appointment with the Cancer Center please come in thru the  Main Entrance and check in at the main information desk  You need to re-schedule your appointment should you arrive 10 or more minutes late.  We strive to give you quality time with our providers, and arriving late affects you and other patients whose appointments are after yours.  Also, if you no show three or more times for appointments you may be dismissed from the clinic at the providers discretion.     Again, thank you for choosing [facility].  Our hope is that these requests will decrease the amount of time that you wait before being seen by our physicians.       _____________________________________________________________  Should you have questions after your visit to [facility], please contact our office at [phone] between the hours of 8:00 a.m. and 4:30 p.m.  Voicemails left after 4:00 p.m. will not be returned until the following business day.  For prescription refill requests, have your pharmacy contact our office and allow 72 hours.    Cancer Center Support Programs:   > Cancer Support Group  2nd Tuesday of the month 1pm-2pm, Journey Room

## 2018-09-19 ENCOUNTER — Other Ambulatory Visit: Payer: Self-pay

## 2018-09-19 ENCOUNTER — Encounter: Payer: Self-pay | Admitting: Nutrition

## 2018-10-07 ENCOUNTER — Ambulatory Visit: Payer: Managed Care, Other (non HMO) | Admitting: Family Medicine

## 2018-10-09 ENCOUNTER — Ambulatory Visit: Payer: Managed Care, Other (non HMO) | Admitting: Family Medicine

## 2019-02-25 ENCOUNTER — Ambulatory Visit: Payer: Managed Care, Other (non HMO) | Admitting: Family Medicine

## 2019-02-25 ENCOUNTER — Other Ambulatory Visit: Payer: Self-pay

## 2019-02-25 VITALS — BP 126/80 | Temp 98.0°F | Ht 76.0 in | Wt 387.0 lb

## 2019-02-25 DIAGNOSIS — Z79899 Other long term (current) drug therapy: Secondary | ICD-10-CM

## 2019-02-25 DIAGNOSIS — J4520 Mild intermittent asthma, uncomplicated: Secondary | ICD-10-CM

## 2019-02-25 DIAGNOSIS — G4733 Obstructive sleep apnea (adult) (pediatric): Secondary | ICD-10-CM | POA: Diagnosis not present

## 2019-02-25 DIAGNOSIS — F5101 Primary insomnia: Secondary | ICD-10-CM

## 2019-02-25 DIAGNOSIS — Z1329 Encounter for screening for other suspected endocrine disorder: Secondary | ICD-10-CM

## 2019-02-25 DIAGNOSIS — Z13220 Encounter for screening for lipoid disorders: Secondary | ICD-10-CM

## 2019-02-25 DIAGNOSIS — Z0000 Encounter for general adult medical examination without abnormal findings: Secondary | ICD-10-CM

## 2019-02-25 DIAGNOSIS — F419 Anxiety disorder, unspecified: Secondary | ICD-10-CM | POA: Diagnosis not present

## 2019-02-25 MED ORDER — CLONAZEPAM 0.5 MG PO TABS: ORAL_TABLET | ORAL | 5 refills | Status: DC

## 2019-02-25 NOTE — Progress Notes (Signed)
Subjective:    Patient ID: Aaron Chambers, male    DOB: 1980/07/28, 38 y.o.   MRN: [id number]  HPI  The patient comes in today for a wellness visit.  And for follow-up of numerous chronic health concerns  A review of their health history was completed.  A review of medications was also completed.  Any needed refills; yes  Eating habits: trying to eat better- eats crazy hours  Falls/  MVA accidents in past few months: none  Regular exercise: walking  Specialist pt sees on regular basis: none  Preventative health issues were discussed.   Additional concerns: stress level high  Anxiety ongoing a challeng  Asthma fair control.  Patient uses albuterol as needed.  Perhaps a couple times a week.  No major problems with it currently.  Continues to use a CPAP device.  Uses it faithfully.  No obvious side effects.  Definitely helps his daytime drowsiness.  Patient compliant with insomnia medication. Generally takes most nights. No obvious morning drowsiness. Definitely helps patient sleep. Without it patient states would not get a good nights rest.   Walking quit e a bit   working with dietician and has cut down calories soe   Review of Systems  Constitutional: Negative for activity change, appetite change and fever.  HENT: Negative for congestion and rhinorrhea.   Eyes: Negative for discharge.  Respiratory: Negative for cough and wheezing.   Cardiovascular: Negative for chest pain.  Gastrointestinal: Negative for abdominal pain, blood in stool and vomiting.  Genitourinary: Negative for difficulty urinating and frequency.  Musculoskeletal: Negative for neck pain.  Skin: Negative for rash.  Allergic/Immunologic: Negative for environmental allergies and food allergies.  Neurological: Negative for weakness and headaches.  Psychiatric/Behavioral: Negative for agitation.  All other systems reviewed and are negative.      Objective:   Physical Exam Vitals signs  reviewed.  Constitutional:      Appearance: He is well-developed.  HENT:     Head: Normocephalic and atraumatic.     Right Ear: External ear normal.     Left Ear: External ear normal.     Nose: Nose normal.  Eyes:     Pupils: Pupils are equal, round, and reactive to light.  Neck:     Musculoskeletal: Normal range of motion and neck supple.     Thyroid: No thyromegaly.  Cardiovascular:     Rate and Rhythm: Normal rate and regular rhythm.     Heart sounds: Normal heart sounds. No murmur.  Pulmonary:     Effort: Pulmonary effort is normal. No respiratory distress.     Breath sounds: Normal breath sounds. No wheezing.  Abdominal:     General: Bowel sounds are normal. There is no distension.     Palpations: Abdomen is soft. There is no mass.     Tenderness: There is no abdominal tenderness.  Genitourinary:    Penis: Normal.   Musculoskeletal: Normal range of motion.  Lymphadenopathy:     Cervical: No cervical adenopathy.  Skin:    General: Skin is warm and dry.     Findings: No erythema.  Neurological:     Mental Status: He is alert.     Motor: No abnormal muscle tone.  Psychiatric:        Behavior: Behavior normal.        Judgment: Judgment normal.           Assessment & Plan:  Impression 1 wellness exam.  Diet discussed.  Exercise discussed.  Morbid obesity discussed once again.  Patient not willing to go for bariatric intervention at this time  2.  Asthma clinically stable proper use of albuterol discussed  3.  Obstructive sleep apnea.  Certainly complicated by obesity.  Uses device faithfully.  Definitely continues to help.  Uses remotes at night.  Sleeping much better.  Daytime energy much better  4.  Insomnia ongoing.  With ongoing need for medications  5.  Generalized anxiety disorder.  Still substantial issue.  and wants to take on Lexapro.  Follow-up in 6 months diet exercise discussed medications refilled appropriate blood work

## 2019-02-26 LAB — BASIC METABOLIC PANEL
BUN/Creatinine Ratio: 16 (ref 9–20)
BUN: 12 mg/dL (ref 6–20)
CO2: 22 mmol/L (ref 20–29)
Calcium: 9.8 mg/dL (ref 8.7–10.2)
Chloride: 99 mmol/L (ref 96–106)
Creatinine, Ser: 0.77 mg/dL (ref 0.76–1.27)
GFR calc Af Amer: 134 mL/min/{1.73_m2} (ref >59)
GFR calc non Af Amer: 116 mL/min/{1.73_m2} (ref >59)
Glucose: 281 mg/dL — ABNORMAL HIGH (ref 65–99)
Potassium: 4.2 mmol/L (ref 3.5–5.2)
Sodium: 137 mmol/L (ref 134–144)

## 2019-02-26 LAB — CBC WITH DIFFERENTIAL/PLATELET
Basophils Absolute: 0 10*3/uL (ref 0.0–0.2)
Basos: 0 %
EOS (ABSOLUTE): 0.2 10*3/uL (ref 0.0–0.4)
Eos: 3 %
Hematocrit: 41.3 % (ref 37.5–51.0)
Hemoglobin: 13.7 g/dL (ref 13.0–17.7)
Immature Grans (Abs): 0 10*3/uL (ref 0.0–0.1)
Immature Granulocytes: 0 %
Lymphocytes Absolute: 1.8 10*3/uL (ref 0.7–3.1)
Lymphs: 32 %
MCH: 28.2 pg (ref 26.6–33.0)
MCHC: 33.2 g/dL (ref 31.5–35.7)
MCV: 85 fL (ref 79–97)
Monocytes Absolute: 0.5 10*3/uL (ref 0.1–0.9)
Monocytes: 9 %
Neutrophils Absolute: 3.1 10*3/uL (ref 1.4–7.0)
Neutrophils: 56 %
Platelets: 149 10*3/uL — ABNORMAL LOW (ref 150–450)
RBC: 4.85 x10E6/uL (ref 4.14–5.80)
RDW: 12.8 % (ref 11.6–15.4)
WBC: 5.5 10*3/uL (ref 3.4–10.8)

## 2019-02-26 LAB — LIPID PANEL
Chol/HDL Ratio: 4.6 ratio (ref 0.0–5.0)
Cholesterol, Total: 192 mg/dL (ref 100–199)
HDL: 42 mg/dL (ref >39)
LDL Calculated: 118 mg/dL — ABNORMAL HIGH (ref 0–99)
Triglycerides: 159 mg/dL — ABNORMAL HIGH (ref 0–149)
VLDL Cholesterol Cal: 32 mg/dL (ref 5–40)

## 2019-02-26 LAB — HEPATIC FUNCTION PANEL
ALT: 164 IU/L — ABNORMAL HIGH (ref 0–44)
AST: 116 IU/L — ABNORMAL HIGH (ref 0–40)
Albumin: 4.4 g/dL (ref 4.0–5.0)
Alkaline Phosphatase: 97 IU/L (ref 39–117)
Bilirubin Total: 0.7 mg/dL (ref 0.0–1.2)
Bilirubin, Direct: 0.17 mg/dL (ref 0.00–0.40)
Total Protein: 6.8 g/dL (ref 6.0–8.5)

## 2019-02-26 LAB — TSH: TSH: 1.49 u[IU]/mL (ref 0.450–4.500)

## 2019-03-11 ENCOUNTER — Telehealth: Payer: Self-pay | Admitting: Family Medicine

## 2019-03-11 NOTE — Telephone Encounter (Signed)
Pt contacted and verbalized understanding.  

## 2019-03-11 NOTE — Telephone Encounter (Signed)
Please advise. Thank you

## 2019-03-11 NOTE — Telephone Encounter (Signed)
Patient is scheduled to come in the office tomorrow for appt with Dr. Richardson Landry and an A1c check.  He is requesting that his wife come in with him because he said that he has anxiety and will not be able to comprehend what Dr. Richardson Landry is telling him.  I told him we would have to ask permission.  Both have been prescreened.

## 2019-03-11 NOTE — Telephone Encounter (Signed)
ok 

## 2019-03-12 ENCOUNTER — Ambulatory Visit: Payer: Managed Care, Other (non HMO) | Admitting: Family Medicine

## 2019-03-12 ENCOUNTER — Other Ambulatory Visit: Payer: Self-pay

## 2019-03-12 ENCOUNTER — Encounter: Payer: Self-pay | Admitting: Family Medicine

## 2019-03-12 VITALS — BP 126/88 | Temp 97.2°F | Ht 76.0 in | Wt 382.0 lb

## 2019-03-12 DIAGNOSIS — R748 Abnormal levels of other serum enzymes: Secondary | ICD-10-CM | POA: Diagnosis not present

## 2019-03-12 DIAGNOSIS — E119 Type 2 diabetes mellitus without complications: Secondary | ICD-10-CM

## 2019-03-12 LAB — POCT GLYCOSYLATED HEMOGLOBIN (HGB A1C): Hemoglobin A1C: 8.7 % — AB (ref 4.0–5.6)

## 2019-03-12 MED ORDER — BLOOD GLUCOSE METER KIT: PACK | 5 refills | Status: AC

## 2019-03-12 MED ORDER — METFORMIN HCL 500 MG PO TABS: ORAL_TABLET | ORAL | 5 refills | Status: DC

## 2019-03-12 NOTE — Progress Notes (Signed)
   Subjective:  Patient arrives with his wife for an extremely protracted discussion regarding new onset type 2 diabetes and morbid obesity and persistent elevated liver enzymes which are rising  Patient ID: Aaron Chambers, male    DOB: March 31, 1981, 38 y.o.   MRN: [id number]  HPIpt arrives today to discuss new onset of diabetes and elevated liver enzymes.   Results for orders placed or performed in visit on 03/12/19  POCT glycosylated hemoglobin (Hb A1C)  Result Value Ref Range   Hemoglobin A1C 8.7 (A) 4.0 - 5.6 %   HbA1c POC (<> result, manual entry)     HbA1c, POC (prediabetic range)     HbA1c, POC (controlled diabetic range)     Some fam hx of type two diabetes  Self grades diet as fair but not great  Ice cream and snacks eats too much..  But tends to eat too much   Drinks some soft drinks  There is positive family history of type 2 diabetes.  Patient has had mild elevation of sugars up until this blood work drawn.  Patient is noted very increased urinary frequency over the last several months.  Also with increased thirst.  Patient continues to maintain substantial over weight status.  No alcohol use Review of Systems No headache, no major weight loss or weight gain, no chest pain no back pain abdominal pain no change in bowel habits complete ROS otherwise negative     Objective:   Physical Exam   Alert and oriented, vitals reviewed and stable, NAD ENT-TM's and ext canals WNL bilat via otoscopic exam Soft palate, tonsils and post pharynx WNL via oropharyngeal exam Neck-symmetric, no masses; thyroid nonpalpable and nontender Pulmonary-no tachypnea or accessory muscle use; Clear without wheezes via auscultation Card--no abnrml murmurs, rhythm reg and rate WNL Carotid pulses symmetric, without bruits Foot exam sensation intact.  Pulses strong.  Trace edema positive venous stasis change     Assessment & Plan:  Impression 1 type 2 diabetes.  New onset.  Extremely  long discussion held.  Patient to get back to his dietitian.  Initiate Metformin rationale discussed.  Long-term implications discussed.  Yearly eye exams encouraged and wait for a couple months till glucose is stabilized.  Diet discussed.  Blood sugars drinks a significant sedation.  Check several fasting sugars per week.  Glucometer prescribed.  Call as in a couple weeks with results  2.  Liver enzyme elevation.  Worsening.  Positive fatty liver history.  Will press onto elastography to further risk  I discussed  3.  Morbid obesity.  Finally patient agrees to referral in this regard.  We will press on with it certainly important for his long-term survival health  Many numerous questions answered regarding all 3 of these issues today easily 45 minutes spent  Greater than 50% of this 40 minute face to face visit was spent in counseling and discussion and coordination of care regarding the above diagnosis/diagnosies

## 2019-03-14 ENCOUNTER — Other Ambulatory Visit (HOSPITAL_COMMUNITY): Payer: Self-pay

## 2019-03-21 ENCOUNTER — Ambulatory Visit (HOSPITAL_COMMUNITY): Payer: Managed Care, Other (non HMO) | Admitting: Hematology

## 2019-03-21 ENCOUNTER — Ambulatory Visit (HOSPITAL_COMMUNITY): Admission: RE | Admit: 2019-03-21 | Payer: Managed Care, Other (non HMO) | Source: Ambulatory Visit

## 2019-03-26 ENCOUNTER — Other Ambulatory Visit: Payer: Self-pay

## 2019-03-26 ENCOUNTER — Ambulatory Visit (HOSPITAL_COMMUNITY)
Admission: RE | Admit: 2019-03-26 | Discharge: 2019-03-26 | Disposition: A | Discharge status: Home / Self Care | Payer: Managed Care, Other (non HMO) | Source: Ambulatory Visit | Attending: Family Medicine | Admitting: Family Medicine

## 2019-03-26 DIAGNOSIS — R748 Abnormal levels of other serum enzymes: Secondary | ICD-10-CM | POA: Insufficient documentation

## 2019-03-26 DIAGNOSIS — E119 Type 2 diabetes mellitus without complications: Secondary | ICD-10-CM | POA: Diagnosis present

## 2019-03-27 ENCOUNTER — Inpatient Hospital Stay (HOSPITAL_COMMUNITY): Payer: Managed Care, Other (non HMO) | Attending: Hematology | Admitting: Hematology

## 2019-03-31 NOTE — Addendum Note (Signed)
Addended by: Vicente Males on: 03/31/2019 02:00 PM   Modules accepted: Orders

## 2019-04-01 ENCOUNTER — Encounter: Payer: Self-pay | Admitting: Family Medicine

## 2019-04-08 ENCOUNTER — Encounter: Payer: Self-pay | Admitting: Family Medicine

## 2019-04-28 ENCOUNTER — Telehealth: Payer: Self-pay | Admitting: Family Medicine

## 2019-04-28 ENCOUNTER — Other Ambulatory Visit: Payer: Self-pay | Admitting: *Deleted

## 2019-04-28 MED ORDER — GLIPIZIDE 5 MG PO TABS: 5.0000 mg | ORAL_TABLET | Freq: Every day | ORAL | 5 refills | Status: DC

## 2019-04-28 NOTE — Telephone Encounter (Signed)
Discussed with pt and he verbalized understanding. Med sent to the pharm.

## 2019-04-28 NOTE — Telephone Encounter (Signed)
Ok, add glipizide five mg p o qam, 6 mo worth, do not skip lunch with this med will lead to low sugar spells

## 2019-04-28 NOTE — Telephone Encounter (Signed)
Left message to return call 

## 2019-04-28 NOTE — Telephone Encounter (Signed)
Patient calling to let us know his blood sugars are running around 160-175 when he checks t hem  Michela Pitcher he is trying to eat right.  He is also having problems with his meter saying error a lot.  It takes him about 3 test strips to get a reading.  I told him to check with [facility] and that he may have to take the machine up there to make sure it is working correctly because he may have gotten a defective machine.  He is going to check with them about that but wanted to let Dr. Richardson Landry know what his sugars have been.

## 2019-05-12 ENCOUNTER — Encounter: Payer: Self-pay | Admitting: Family Medicine

## 2019-05-12 ENCOUNTER — Other Ambulatory Visit: Payer: Self-pay

## 2019-05-12 ENCOUNTER — Ambulatory Visit (INDEPENDENT_AMBULATORY_CARE_PROVIDER_SITE_OTHER): Payer: Managed Care, Other (non HMO) | Admitting: Family Medicine

## 2019-05-12 VITALS — BP 124/80 | Temp 97.7°F | Ht 76.0 in | Wt 368.0 lb

## 2019-05-12 DIAGNOSIS — G4733 Obstructive sleep apnea (adult) (pediatric): Secondary | ICD-10-CM | POA: Diagnosis not present

## 2019-05-12 DIAGNOSIS — Z23 Encounter for immunization: Secondary | ICD-10-CM

## 2019-05-12 DIAGNOSIS — E119 Type 2 diabetes mellitus without complications: Secondary | ICD-10-CM

## 2019-05-12 DIAGNOSIS — R748 Abnormal levels of other serum enzymes: Secondary | ICD-10-CM

## 2019-05-12 DIAGNOSIS — F419 Anxiety disorder, unspecified: Secondary | ICD-10-CM

## 2019-05-12 MED ORDER — ESCITALOPRAM OXALATE 20 MG PO TABS: 20.0000 mg | ORAL_TABLET | Freq: Every day | ORAL | 5 refills | Status: DC

## 2019-05-12 MED ORDER — METFORMIN HCL 500 MG PO TABS: ORAL_TABLET | ORAL | 5 refills | Status: DC

## 2019-05-12 MED ORDER — GLIPIZIDE 5 MG PO TABS: 5.0000 mg | ORAL_TABLET | Freq: Every day | ORAL | 5 refills | Status: DC

## 2019-05-12 MED ORDER — CLONAZEPAM 0.5 MG PO TABS: ORAL_TABLET | ORAL | 5 refills | Status: DC

## 2019-05-12 NOTE — Progress Notes (Signed)
   Subjective:    Patient ID: Aaron Chambers, male    DOB: 17-May-1981, 38 y.o.   MRN: [id number]  Diabetes He presents for his follow-up diabetic visit. Current diabetic treatments: metformin, glipizide. He is following a generally healthy diet. Exercise: walk 45 mins - one hour 3 - 5 days a week. Home blood sugar record trend: 70 - 125. Eye exam is not current.  has met with dietician and has been losing weight.  A1C done 2 months ago.  Patient claims compliance with diabetes medication. No obvious side effects. Reports no substantial low sugar spells. Most numbers are generally in good range when checked fasting. Generally does not miss a dose of medication. Watching diabetic diet closely  Patient claims compliance with diabetes medication. No obvious side effects. Reports no substantial low sugar spells. Most numbers are generally in good range when checked fasting. Generally does not miss a dose of medication. Watching diabetic diet closely   Sugar is often in the 80s when he feels jittery and anxious  Due to see gi for potentia cirrhosis    Exercising three to four d per week    Anxiety overall decnt   Feels    Review of Systems No headache, no major weight loss or weight gain, no chest pain no back pain abdominal pain no change in bowel habits complete ROS otherwise negative     Objective:   Physical Exam  Alert and oriented, vitals reviewed and stable, NAD ENT-TM's and ext canals WNL bilat via otoscopic exam Soft palate, tonsils and post pharynx WNL via oropharyngeal exam Neck-symmetric, no masses; thyroid nonpalpable and nontender Pulmonary-no tachypnea or accessory muscle use; Clear without wheezes via auscultation Card--no abnrml murmurs, rhythm reg and rate WNL Carotid pulses symmetric, without bruits Feet arterial pulses good.  Sensation intact.  Chronic venous stasis changes ankles      Assessment & Plan:  Impression 1 type 2 diabetes clinically much  improved discussed to maintain same approach some borderline low numbers avoidance discussed  2.  Chronic anxiety.  Ongoing.  Compliant with medications.  No obvious difficulty with meds  3.  Elevated liver enzymes with elastography suggesting potential for cirrhosis.  Discussed.  Due to see GI doctor soon  4.  Morbid obesity.  Discussion held has lost 20 pounds.  5.  Sleep apnea clinically stable  Follow-up in 4 months

## 2019-05-28 ENCOUNTER — Other Ambulatory Visit: Payer: Self-pay

## 2019-05-28 ENCOUNTER — Encounter (INDEPENDENT_AMBULATORY_CARE_PROVIDER_SITE_OTHER): Payer: Self-pay | Admitting: Nurse Practitioner

## 2019-05-28 ENCOUNTER — Ambulatory Visit (INDEPENDENT_AMBULATORY_CARE_PROVIDER_SITE_OTHER): Payer: Managed Care, Other (non HMO) | Admitting: Nurse Practitioner

## 2019-05-28 VITALS — BP 130/83 | HR 78 | Temp 97.9°F | Ht 76.0 in | Wt 372.3 lb

## 2019-05-28 DIAGNOSIS — R7989 Other specified abnormal findings of blood chemistry: Secondary | ICD-10-CM | POA: Diagnosis not present

## 2019-05-28 DIAGNOSIS — K625 Hemorrhage of anus and rectum: Secondary | ICD-10-CM | POA: Diagnosis not present

## 2019-05-28 NOTE — Patient Instructions (Signed)
1.  Complete the provided lab order  2.  MiraLAX 1 capful mixed in 8 ounces of water at bedtime as needed  3.  Reduce sugar intake, reduce bread, rice and pasta intake  4.  Continue to exercise for 5 minutes 3-4 days weekly  5.  Call our office if your rectal bleeding worsens.  Follow-up in the office in 2 months, to discuss scheduling a colonoscopy at the time of this follow-up appointment.

## 2019-05-28 NOTE — Progress Notes (Signed)
Subjective:    Patient ID: Aaron Chambers, male    DOB: 09-26-1980, 38 y.o.   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 He presents today for further evaluation elevated LFTs and hepatic steatosis.  He underwent an abdominal ultrasound with elastography  03/26/2019 with a Metavir fibrosis score of F3-F4.  He reports having a history of  elevated LFTs since highschool.  He drank heavily between the ages of [age].  He currently drinks 1 or 2 beers every few months.  No history of IV drug use.  Infrequent NSAID use.  He takes BC powder 1 packet every 3 months for headaches.  He is aware to avoid aspirin and NSAID products especially while he is on Eliquis.  No family history of liver disease.  He denies having any nausea or vomiting.  No heartburn, dysphagia or stomach pain.  He passes a normal formed stool most days.  He reports a history of an anal fissure several years ago as diagnosed by his primary care physician.  The fissure initially occurred after he passed a large bowel movement.  He was referred to a general surgeon for fissure evaluation and possible sphincterotomy but the patient did not pursue this consultation.  He reports seeing a small amount of red blood on the stool, in the water and on the toilet paper approximately twice monthly.  No associated rectal pain.  No family history of colorectal cancer.  Father died from lung cancer.  He acknowledges the need to lose weight.  He is attempting to eat healthier and to eat smaller portions.   Labs 02/25/2019: Sodium 137.  Potassium 4.2.  BUN 12.  Creatinine 0.77.  Albumin 4.4.  Alk phos 97.  AST 116.  ALT 164.  Total bili 0.7.  Cholesterol 192.  LDL 118.  Triglyceride 159.  HDL 42.  WBC 5.5.  Hemoglobin 13.7.  Hematocrit 41.3.  Platelet 149.  03/25/2018 HIV Nonreactive    Abdominal ultrasound with elastography 03/26/2019: Liver: There is diffuse increased liver echogenicity. Evaluation for liver lesion is very limited due to increased background echogenicity. The main portal vein images are not provided but it has been reported that the portal vein is patent with normal flow direction. Liver: Echogenic liver. Elastography: Median hepatic shear wave velocity is calculated at 2.37 m/sec. Corresponding Metavir fibrosis score is Some F3 + F4. Risk of fibrosis is High. Follow-up: Follow up advised  Past Medical History:  Diagnosis Date  . Allergic rhinitis   . Anxiety   . Arrhythmia   . Asthma   . Kidney stones   . Obesity    Past Surgical History:  Procedure Laterality Date  . CARDIAC ELECTROPHYSIOLOGY STUDY AND ABLATION    . VASCULAR SURGERY      Current Outpatient Medications on File Prior to Visit  Medication Sig Dispense Refill  . albuterol (PROVENTIL HFA;VENTOLIN HFA) 108 (90 Base) MCG/ACT inhaler Inhale 2 puffs into the lungs every 6 (six) hours as needed for wheezing or shortness of breath. 1 Inhaler 2  . apixaban (ELIQUIS) 2.5 MG TABS tablet Take 1 tablet (2.5 mg total) by mouth 2 (two) times daily. 60 tablet 6  . blood glucose meter kit and supplies Dispense based on patient and insurance preference. Use to check glucose once daily (FOR ICD-10  E11.9). 1 each 5  .  clonazePAM (KLONOPIN) 0.5 MG tablet TAKE 1 TABLET BY MOUTH UP TO TWICE DAILY AS NEEDED. MUST LAST 30 DAYS. 30 tablet 5  . escitalopram (LEXAPRO) 20 MG tablet Take 1 tablet (20 mg total) by mouth daily. 30 tablet 5  . glipiZIDE (GLUCOTROL) 5 MG tablet Take 1 tablet (5 mg total) by mouth daily before breakfast. 30 tablet 5  . metFORMIN (GLUCOPHAGE) 500 MG tablet take 2 bid 120 tablet 5  . Multiple Vitamin (MULTIVITAMIN WITH MINERALS) TABS tablet Take 1 tablet by mouth daily.     No current facility-administered medications on file prior to visit.    No Known Allergies  Social  History   Socioeconomic History  . Marital status: Married    Spouse name: Not on file  . Number of children: Not on file  . Years of education: Not on file  . Highest education level: Not on file  Occupational History  . Not on file  Social Needs  . Financial resource strain: Not on file  . Food insecurity    Worry: Not on file    Inability: Not on file  . Transportation needs    Medical: Not on file    Non-medical: Not on file  Tobacco Use  . Smoking status: Never Smoker  . Smokeless tobacco: Current User    Types: Snuff  Substance and Sexual Activity  . Alcohol use: No  . Drug use: No  . Sexual activity: Not on file  Lifestyle  . Physical activity    Days per week: Not on file    Minutes per session: Not on file  . Stress: Not on file  Relationships  . Social Herbalist on phone: Not on file    Gets together: Not on file    Attends religious service: Not on file    Active member of club or organization: Not on file    Attends meetings of clubs or organizations: Not on file    Relationship status: Not on file  . Intimate partner violence    Fear of current or ex partner: Not on file    Emotionally abused: Not on file    Physically abused: Not on file    Forced sexual activity: Not on file  Other Topics Concern  . Not on file  Social History Narrative  . Not on file   Review of Systems see HPI, all other systems reviewed and are negative     Objective:   Physical Exam BP 130/83   Pulse 78   Temp 97.9 F (36.6 C) (Oral)   Ht '6\' 4"'$  (1.93 m)   Wt (!) 372 lb 4.8 oz (168.9 kg)   BMI 45.32 kg/m  General: 38 year old obese white male in no acute distress Eyes: Sclera nonicteric, conjunctiva pink Mouth: Dentition intact, no ulcers or lesions Neck: Supple Heart: Regular rate and rhythm, no murmurs Lungs: Breath sounds clear throughout Abdomen: Soft, nontender, no masses or organomegaly appreciated in the setting of an obese abdomen Extremities:  No edema Neuro: Alert and oriented x4, no focal deficits    Assessment & Plan:   50.  38 year old male with elevated LFTs.  Abdominal ultrasound with elastography indicates patient is at high risk for fibrosis/cirrhosis. -Check hepatitis A, B and C serologies, iron, ferritin, ceruloplasmin, ANA, SMA, AMA, alpha-1 antitrypsin level, IgG and celiac panel -Reduce carbohydrate intake, reduce sweets, rice, pasta and bread intake -Exercise for 45 minutes 3 to 4 days weekly -Further follow-up to be determined  after the above lab results reviewed  2.  Rectal bleeding, history of an anal fissure -Patient to avoid constipation -Miralax 1 capful mixed in 8 ounces of water at bedtime -Patient to call the office if his rectal bleeding worsens -Advised the patient to follow-up in the office in 2 months to further discuss scheduling a colonoscopy  3.  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  He remains on Eliquis.  4.  Morbid obesity. -see plan in # 1  5.  Diabetes mellitus type 2  6.  Sleep apnea  7.  Anxiety

## 2019-05-30 LAB — HEPATITIS C ANTIBODY
Hepatitis C Ab: NONREACTIVE
SIGNAL TO CUT-OFF: 0.02 (ref <1.00)

## 2019-05-30 LAB — HEPATIC FUNCTION PANEL
AG Ratio: 1.8 (calc) (ref 1.0–2.5)
ALT: 68 U/L — ABNORMAL HIGH (ref 9–46)
AST: 39 U/L (ref 10–40)
Albumin: 4.3 g/dL (ref 3.6–5.1)
Alkaline phosphatase (APISO): 75 U/L (ref 36–130)
Bilirubin, Direct: 0.1 mg/dL (ref 0.0–0.2)
Globulin: 2.4 g/dL (calc) (ref 1.9–3.7)
Indirect Bilirubin: 0.4 mg/dL (calc) (ref 0.2–1.2)
Total Bilirubin: 0.5 mg/dL (ref 0.2–1.2)
Total Protein: 6.7 g/dL (ref 6.1–8.1)

## 2019-05-30 LAB — HEPATITIS B SURFACE ANTIBODY,QUALITATIVE: Hep B S Ab: NONREACTIVE

## 2019-05-30 LAB — CBC WITH DIFFERENTIAL/PLATELET
Absolute Monocytes: 731 cells/uL (ref 200–950)
Basophils Absolute: 17 cells/uL (ref 0–200)
Basophils Relative: 0.2 %
Eosinophils Absolute: 340 cells/uL (ref 15–500)
Eosinophils Relative: 4 %
HCT: 42.2 % (ref 38.5–50.0)
Hemoglobin: 14 g/dL (ref 13.2–17.1)
Lymphs Abs: 2448 cells/uL (ref 850–3900)
MCH: 28.2 pg (ref 27.0–33.0)
MCHC: 33.2 g/dL (ref 32.0–36.0)
MCV: 85.1 fL (ref 80.0–100.0)
MPV: 10.9 fL (ref 7.5–12.5)
Monocytes Relative: 8.6 %
Neutro Abs: 4964 cells/uL (ref 1500–7800)
Neutrophils Relative %: 58.4 %
Platelets: 199 10*3/uL (ref 140–400)
RBC: 4.96 10*6/uL (ref 4.20–5.80)
RDW: 13.1 % (ref 11.0–15.0)
Total Lymphocyte: 28.8 %
WBC: 8.5 10*3/uL (ref 3.8–10.8)

## 2019-05-30 LAB — IRON: Iron: 53 ug/dL (ref 50–180)

## 2019-05-30 LAB — BASIC METABOLIC PANEL
BUN: 14 mg/dL (ref 7–25)
CO2: 26 mmol/L (ref 20–32)
Calcium: 9.9 mg/dL (ref 8.6–10.3)
Chloride: 105 mmol/L (ref 98–110)
Creat: 0.7 mg/dL (ref 0.60–1.35)
Glucose, Bld: 99 mg/dL (ref 65–139)
Potassium: 4.2 mmol/L (ref 3.5–5.3)
Sodium: 140 mmol/L (ref 135–146)

## 2019-05-30 LAB — IGG: IgG (Immunoglobin G), Serum: 1006 mg/dL (ref 600–1640)

## 2019-05-30 LAB — CELIAC DISEASE PANEL
(tTG) Ab, IgA: 1 U/mL
(tTG) Ab, IgG: 3 U/mL
Gliadin IgA: 4 Units
Gliadin IgG: 3 Units
Immunoglobulin A: 155 mg/dL (ref 47–310)

## 2019-05-30 LAB — FERRITIN: Ferritin: 183 ng/mL (ref 38–380)

## 2019-05-30 LAB — HEPATITIS B CORE ANTIBODY, TOTAL: Hep B Core Total Ab: NONREACTIVE

## 2019-05-30 LAB — HEPATITIS A ANTIBODY, TOTAL: Hepatitis A AB,Total: NONREACTIVE

## 2019-05-30 LAB — MITOCHONDRIAL ANTIBODIES: Mitochondrial M2 Ab, IgG: <=20 U

## 2019-05-30 LAB — CERULOPLASMIN: Ceruloplasmin: 32 mg/dL (ref 18–36)

## 2019-05-30 LAB — ANTI-SMOOTH MUSCLE ANTIBODY, IGG: Actin (Smooth Muscle) Antibody (IGG): <20 U (ref <20)

## 2019-05-30 LAB — HEPATITIS B SURFACE ANTIGEN: Hepatitis B Surface Ag: NONREACTIVE

## 2019-05-30 LAB — ANA: Anti Nuclear Antibody (ANA): NEGATIVE

## 2019-05-30 LAB — ALPHA-1-ANTITRYPSIN: A-1 Antitrypsin, Ser: 163 mg/dL (ref 83–199)

## 2019-08-11 ENCOUNTER — Encounter: Payer: Self-pay | Admitting: Family Medicine

## 2019-08-13 ENCOUNTER — Encounter (INDEPENDENT_AMBULATORY_CARE_PROVIDER_SITE_OTHER): Payer: Self-pay

## 2019-08-16 ENCOUNTER — Other Ambulatory Visit: Payer: Self-pay | Admitting: Family Medicine

## 2019-08-20 ENCOUNTER — Ambulatory Visit (INDEPENDENT_AMBULATORY_CARE_PROVIDER_SITE_OTHER): Payer: Managed Care, Other (non HMO) | Admitting: Gastroenterology

## 2019-08-25 ENCOUNTER — Ambulatory Visit (INDEPENDENT_AMBULATORY_CARE_PROVIDER_SITE_OTHER): Payer: Managed Care, Other (non HMO) | Admitting: Family Medicine

## 2019-08-25 ENCOUNTER — Other Ambulatory Visit: Payer: Self-pay

## 2019-08-25 DIAGNOSIS — G4733 Obstructive sleep apnea (adult) (pediatric): Secondary | ICD-10-CM

## 2019-08-25 DIAGNOSIS — E119 Type 2 diabetes mellitus without complications: Secondary | ICD-10-CM

## 2019-08-25 DIAGNOSIS — F419 Anxiety disorder, unspecified: Secondary | ICD-10-CM

## 2019-08-25 MED ORDER — METFORMIN HCL 500 MG PO TABS: ORAL_TABLET | ORAL | 5 refills | Status: DC

## 2019-08-25 MED ORDER — CLONAZEPAM 0.5 MG PO TABS: ORAL_TABLET | ORAL | 5 refills | Status: DC

## 2019-08-25 MED ORDER — ESCITALOPRAM OXALATE 20 MG PO TABS: 20.0000 mg | ORAL_TABLET | Freq: Every day | ORAL | 5 refills | Status: DC

## 2019-08-25 MED ORDER — GLIPIZIDE 5 MG PO TABS: 5.0000 mg | ORAL_TABLET | Freq: Every day | ORAL | 5 refills | Status: DC

## 2019-08-25 NOTE — Progress Notes (Signed)
   Subjective:  Audio alone  Patient ID: Aaron Chambers, male    DOB: 1981-05-18, 39 y.o.   MRN: [id number]  Diabetes He presents for his follow-up diabetic visit. He has type 2 diabetes mellitus. Current diabetic treatments: metfomin, glipizide. He is compliant with treatment all of the time. Home blood sugar record trend: 85 -115. Eye exam current: tried to get an appointment unable to do to covid.   Needs refills on all meds. phq9 and gad 7 done.   Virtual Visit via Telephone Note  I connected with Bernie Covey on 08/25/19 at  9:00 AM EST by telephone and verified that I am speaking with the correct person using two identifiers.  Location: Patient: home Provider: office   I discussed the limitations, risks, security and privacy concerns of performing an evaluation and management service by telephone and the availability of in person appointments. I also discussed with the patient that there may be a patient responsible charge related to this service. The patient expressed understanding and agreed to proceed.   History of Present Illness:    Observations/Objective:   Assessment and Plan:   Follow Up Instructions:    I discussed the assessment and treatment plan with the patient. The patient was provided an opportunity to ask questions and all were answered. The patient agreed with the plan and demonstrated an understanding of the instructions.   The patient was advised to call back or seek an in-person evaluation if the symptoms worsen or if the condition fails to improve as anticipated.  I provided 25 minutes of non-face-to-face time during this encounter.   Patient claims compliance with diabetes medication. No obvious side effects. Reports no substantial low sugar spells. Most numbers are generally in good range when checked fasting. Generally does not miss a dose of medication. Watching diabetic diet closely  Patient has ongoing challenges with chronic anxiety.   Currently the Lexapro plus as needed benzodiazepine seems to be working okay.  Not perfect but certainly manageable from the patient's perspective.  Patient continue to work on his weight.  He realizes he has morbid obesity needs to lose weight     Review of Systems No headache no chest pain no shortness of breath    Objective:   Physical Exam  Virtual      Assessment & Plan:  Impression type 2 diabetes.  Glucose is tight good control diet discussed compliance with meds discussed no hypoglycemic  2.  Generalized anxiety disorder.  Clinically stable though not perfect control discussed  3.  Morbid obesity.  Patient still aware he needs to work tremendously on this.  Has declined bariatric interventions in the past  4.  Sleep apnea.  Compliant with CPAP  Follow-up in 6 months.  Blood work then diet exercise discussed

## 2019-11-23 ENCOUNTER — Emergency Department (HOSPITAL_COMMUNITY): Payer: Managed Care, Other (non HMO)

## 2019-11-23 ENCOUNTER — Encounter (HOSPITAL_COMMUNITY): Payer: Self-pay | Admitting: Emergency Medicine

## 2019-11-23 ENCOUNTER — Emergency Department (HOSPITAL_COMMUNITY)
Admission: EM | Admit: 2019-11-23 | Discharge: 2019-11-23 | Disposition: A | Discharge status: Home / Self Care | Payer: Managed Care, Other (non HMO) | Attending: Emergency Medicine | Admitting: Emergency Medicine

## 2019-11-23 ENCOUNTER — Other Ambulatory Visit: Payer: Self-pay

## 2019-11-23 DIAGNOSIS — N200 Calculus of kidney: Secondary | ICD-10-CM | POA: Diagnosis not present

## 2019-11-23 DIAGNOSIS — R109 Unspecified abdominal pain: Secondary | ICD-10-CM | POA: Diagnosis present

## 2019-11-23 DIAGNOSIS — J45909 Unspecified asthma, uncomplicated: Secondary | ICD-10-CM | POA: Diagnosis not present

## 2019-11-23 LAB — URINALYSIS, ROUTINE W REFLEX MICROSCOPIC
Bacteria, UA: NONE SEEN
Bilirubin Urine: NEGATIVE
Glucose, UA: NEGATIVE mg/dL
Ketones, ur: NEGATIVE mg/dL
Leukocytes,Ua: NEGATIVE
Nitrite: NEGATIVE
Protein, ur: 30 mg/dL — AB
RBC / HPF: >50 RBC/hpf — ABNORMAL HIGH (ref 0–5)
Specific Gravity, Urine: 1.026 (ref 1.005–1.030)
pH: 5 (ref 5.0–8.0)

## 2019-11-23 LAB — COMPREHENSIVE METABOLIC PANEL
ALT: 66 U/L — ABNORMAL HIGH (ref 0–44)
AST: 38 U/L (ref 15–41)
Albumin: 4.4 g/dL (ref 3.5–5.0)
Alkaline Phosphatase: 82 U/L (ref 38–126)
Anion gap: 10 (ref 5–15)
BUN: 17 mg/dL (ref 6–20)
CO2: 28 mmol/L (ref 22–32)
Calcium: 9.3 mg/dL (ref 8.9–10.3)
Chloride: 103 mmol/L (ref 98–111)
Creatinine, Ser: 1.08 mg/dL (ref 0.61–1.24)
GFR calc Af Amer: >60 mL/min (ref >60)
GFR calc non Af Amer: >60 mL/min (ref >60)
Glucose, Bld: 178 mg/dL — ABNORMAL HIGH (ref 70–99)
Potassium: 3.9 mmol/L (ref 3.5–5.1)
Sodium: 141 mmol/L (ref 135–145)
Total Bilirubin: 0.8 mg/dL (ref 0.3–1.2)
Total Protein: 7.4 g/dL (ref 6.5–8.1)

## 2019-11-23 LAB — CBC
HCT: 45 % (ref 39.0–52.0)
Hemoglobin: 14.2 g/dL (ref 13.0–17.0)
MCH: 27.5 pg (ref 26.0–34.0)
MCHC: 31.6 g/dL (ref 30.0–36.0)
MCV: 87.2 fL (ref 80.0–100.0)
Platelets: 197 10*3/uL (ref 150–400)
RBC: 5.16 MIL/uL (ref 4.22–5.81)
RDW: 14 % (ref 11.5–15.5)
WBC: 9.7 10*3/uL (ref 4.0–10.5)
nRBC: 0 % (ref 0.0–0.2)

## 2019-11-23 MED ORDER — ONDANSETRON HCL 4 MG/2ML IJ SOLN
4.0000 mg | Freq: Once | INTRAMUSCULAR | Status: AC
  Administered 2019-11-23: 4 mg via INTRAVENOUS
  Filled 2019-11-23: qty 2

## 2019-11-23 MED ORDER — HYDROMORPHONE HCL 1 MG/ML IJ SOLN
1.0000 mg | Freq: Once | INTRAMUSCULAR | Status: AC
  Administered 2019-11-23: 1 mg via INTRAVENOUS
  Filled 2019-11-23: qty 1

## 2019-11-23 MED ORDER — SODIUM CHLORIDE 0.9 % IV BOLUS
1000.0000 mL | Freq: Once | INTRAVENOUS | Status: AC
  Administered 2019-11-23: 1000 mL via INTRAVENOUS

## 2019-11-23 MED ORDER — OXYCODONE HCL 5 MG PO TABS: 5.0000 mg | ORAL_TABLET | ORAL | 0 refills | Status: DC | PRN

## 2019-11-23 MED ORDER — HYDROMORPHONE HCL 1 MG/ML IJ SOLN
INTRAMUSCULAR | Status: AC
  Administered 2019-11-23: 1 mg via INTRAVENOUS
  Filled 2019-11-23: qty 1

## 2019-11-23 MED ORDER — TAMSULOSIN HCL 0.4 MG PO CAPS
0.4000 mg | ORAL_CAPSULE | Freq: Every day | ORAL | 0 refills | Status: DC
Start: 2019-11-23 — End: 2020-03-01

## 2019-11-23 MED ORDER — HYDROMORPHONE HCL 1 MG/ML IJ SOLN
1.0000 mg | Freq: Once | INTRAMUSCULAR | Status: AC
  Filled 2019-11-23: qty 1

## 2019-11-23 NOTE — ED Triage Notes (Signed)
Pt with R sided flank pain and urinary frequency. Pt with hx kidney stones.

## 2019-11-23 NOTE — ED Provider Notes (Signed)
[facility] Emergency Department Provider Note MRN:  [id number]  Arrival date & time: 11/23/19     Chief Complaint   Flank Pain   History of Present Illness   Aaron Chambers is a 39 y.o. year-old male with a history of kidney stones, obesity presenting to the ED with chief complaint of flank pain.  Location: Right flank with pain radiating to the right lower quadrant Duration: Several hours Onset: Sudden Timing: Constant Description: Sharp Severity: Severe Exacerbating/Alleviating Factors: None, cannot find a comfortable position Associated Symptoms: Nausea, single episode nonbloody nonbilious emesis Pertinent Negatives: Denies fever, no dysuria, no hematuria, no chest pain or shortness of breath   Review of Systems  A complete 10 system review of systems was obtained and all systems are negative except as noted in the HPI and PMH.   Patient's Health History    Past Medical History:  Diagnosis Date  . Allergic rhinitis   . Anxiety   . Arrhythmia   . Asthma   . Kidney stones   . Obesity     Past Surgical History:  Procedure Laterality Date  . CARDIAC ELECTROPHYSIOLOGY STUDY AND ABLATION    . VASCULAR SURGERY      History reviewed. No pertinent family history.  Social History   Socioeconomic History  . Marital status: Married    Spouse name: Not on file  . Number of children: Not on file  . Years of education: Not on file  . Highest education level: Not on file  Occupational History  . Not on file  Tobacco Use  . Smoking status: Never Smoker  . Smokeless tobacco: Current User    Types: Snuff  Substance and Sexual Activity  . Alcohol use: No  . Drug use: No  . Sexual activity: Not on file  Other Topics Concern  . Not on file  Social History Narrative  . Not on file   Social Determinants of Health   Financial Resource Strain:   . Difficulty of Paying Living Expenses:   Food Insecurity:   . Worried About Sales executive in the Last Year:   . Arboriculturist in the Last Year:   Transportation Needs:   . Film/video editor (Medical):   Marland Kitchen Lack of Transportation (Non-Medical):   Physical Activity:   . Days of Exercise per Week:   . Minutes of Exercise per Session:   Stress:   . Feeling of Stress :   Social Connections:   . Frequency of Communication with Friends and Family:   . Frequency of Social Gatherings with Friends and Family:   . Attends Religious Services:   . Active Member of Clubs or Organizations:   . Attends Archivist Meetings:   Marland Kitchen Marital Status:   Intimate Partner Violence:   . Fear of Current or Ex-Partner:   . Emotionally Abused:   Marland Kitchen Physically Abused:   . Sexually Abused:      Physical Exam   Vitals:   11/23/19 0111  BP: (!) 160/103  Pulse: 72  Resp: 16  Temp: 98.1 F (36.7 C)  SpO2: 96%    CONSTITUTIONAL: Well-appearing, in mild distress, pacing the room NEURO:  Alert and oriented x 3, no focal deficits EYES:  eyes equal and reactive ENT/NECK:  no LAD, no JVD CARDIO: Regular rate, well-perfused, normal S1 and S2 PULM:  CTAB no wheezing or rhonchi GI/GU:  normal bowel sounds, non-distended, non-tender MSK/SPINE:  No gross deformities, no  edema SKIN:  no rash, atraumatic PSYCH:  Appropriate speech and behavior  *Additional and/or pertinent findings included in MDM below  Diagnostic and Interventional Summary    EKG Interpretation  Date/Time:    Ventricular Rate:    PR Interval:    QRS Duration:   QT Interval:    QTC Calculation:   R Axis:     Text Interpretation:        Labs Reviewed  URINALYSIS, ROUTINE W REFLEX MICROSCOPIC - Abnormal; Notable for the following components:      Result Value   APPearance HAZY (*)    Hgb urine dipstick MODERATE (*)    Protein, ur 30 (*)    RBC / HPF >50 (*)    All other components within normal limits  COMPREHENSIVE METABOLIC PANEL - Abnormal; Notable for the following components:   Glucose, Bld  178 (*)    ALT 66 (*)    All other components within normal limits  CBC    CT RENAL STONE STUDY  Final Result      Medications  HYDROmorphone (DILAUDID) injection 1 mg (1 mg Intravenous Given 11/23/19 0205)  ondansetron (ZOFRAN) injection 4 mg (4 mg Intravenous Given 11/23/19 0205)  sodium chloride 0.9 % bolus 1,000 mL (1,000 mLs Intravenous New Bag/Given (Non-Interop) 11/23/19 0205)  HYDROmorphone (DILAUDID) injection 1 mg (1 mg Intravenous Given 11/23/19 0254)     Procedures  /  Critical Care Procedures  ED Course and Medical Decision Making  I have reviewed the triage vital signs, the nursing notes, and pertinent available records from the EMR.  Listed above are laboratory and imaging tests that I personally ordered, reviewed, and interpreted and then considered in my medical decision making (see below for details).      Suspect recurrent kidney stone, awaiting labs, CT.  CT confirms large kidney stone, 8 x 10 mm.  Labs and urinalysis are reassuring.  Patient required multiple doses of Dilaudid but eventually pain was well controlled and he was requesting discharge.  Message sent to Dr. Laverle Patter of [facility], and patient advised to call the office first the Monday morning to set up an appointment.  Elmer Sow. Pilar Plate, MD [facility] Emergency Medicine [facility] [email or web address]  Final Clinical Impressions(s) / ED Diagnoses     ICD-10-CM   1. Kidney stone  N20.0     ED Discharge Orders         Ordered    oxyCODONE (ROXICODONE) 5 MG immediate release tablet  Every 4 hours PRN     11/23/19 0345    tamsulosin (FLOMAX) 0.4 MG CAPS capsule  Daily     11/23/19 0345           Discharge Instructions Discussed with and Provided to Patient:     Discharge Instructions     You were evaluated in the Emergency Department and after careful evaluation, we did not find any emergent condition requiring admission or further testing in the hospital.  Your  exam/testing today was overall reassuring.  Your CT scan confirmed a large kidney stone.  There is a chance he may not be able to pass the stone on your own.  It is important that you call the [facility] office first thing Monday morning to set up a follow-up appointment.  Until then we recommend Tylenol and Motrin for pain as well as the oxycodone medication provided as needed.  We also recommend using the Flomax medication to try to help pass the stone.  Please return to the Emergency Department if you experience any worsening of your condition.  We encourage you to follow up with a primary care provider.  Thank you for allowing [place] to be a part of your care.        Sabas Sous, MD 11/23/19 [phone]

## 2019-11-23 NOTE — Discharge Instructions (Addendum)
You were evaluated in the Emergency Department and after careful evaluation, we did not find any emergent condition requiring admission or further testing in the hospital.  Your exam/testing today was overall reassuring.  Your CT scan confirmed a large kidney stone.  There is a chance he may not be able to pass the stone on your own.  It is important that you call the [facility] office first thing Monday morning to set up a follow-up appointment.  Until then we recommend Tylenol and Motrin for pain as well as the oxycodone medication provided as needed.  We also recommend using the Flomax medication to try to help pass the stone.  Please return to the Emergency Department if you experience any worsening of your condition.  We encourage you to follow up with a primary care provider.  Thank you for allowing [place] to be a part of your care.

## 2019-12-24 IMAGING — US US EXTREM LOW VENOUS*L*
1 series · 13 of 24 positions shown · non-contrast
Comparison: None.

CLINICAL DATA: Left lower extremity pain and edema. Acute pulmonary
embolism.



[Series 1: us extrem low venous*left* · 0.09mm/px · 13 of 36 slices shown]
[im 1/36]
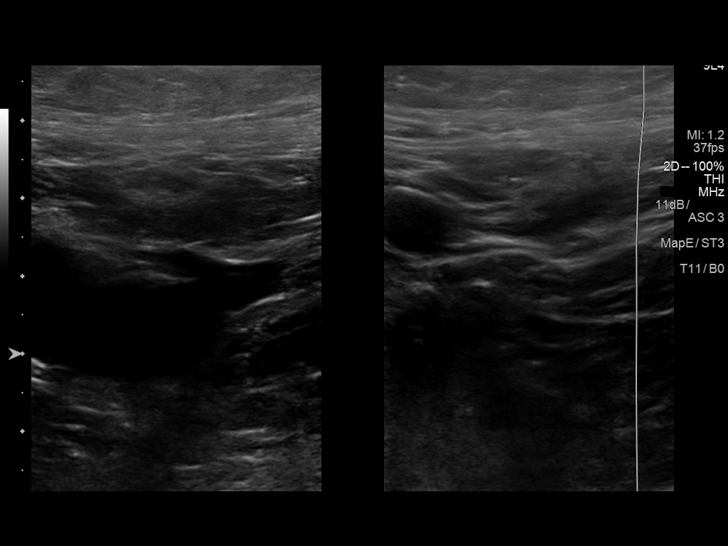
[im 4/36]
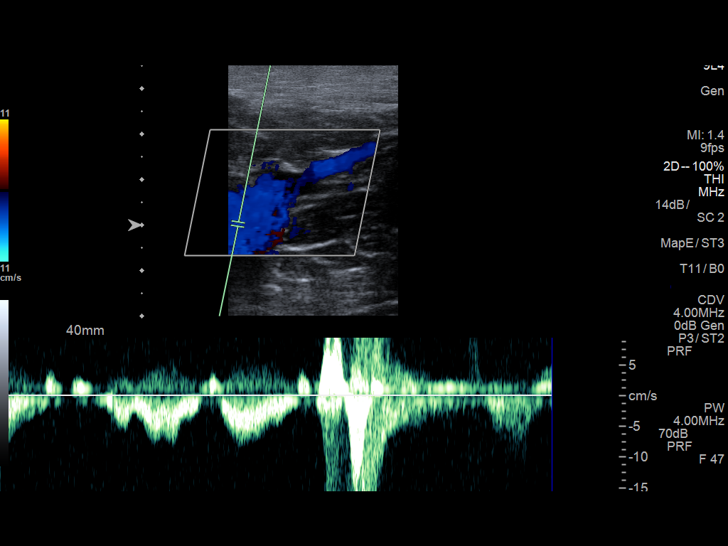
[im 7/36]
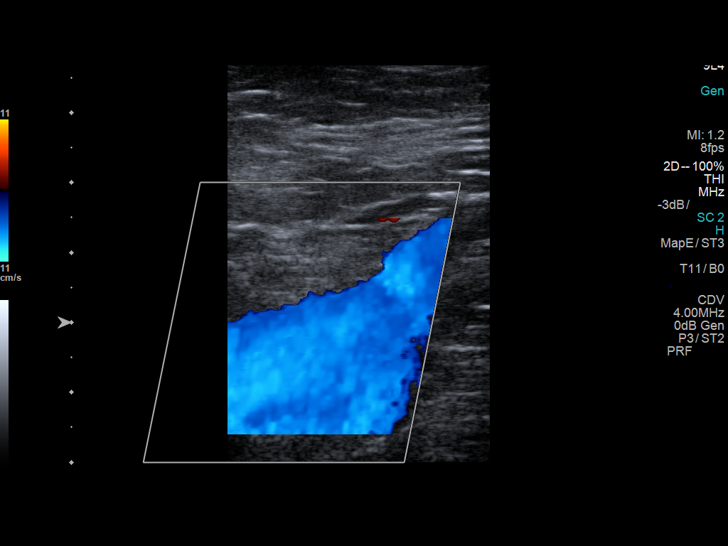
[im 10/36]
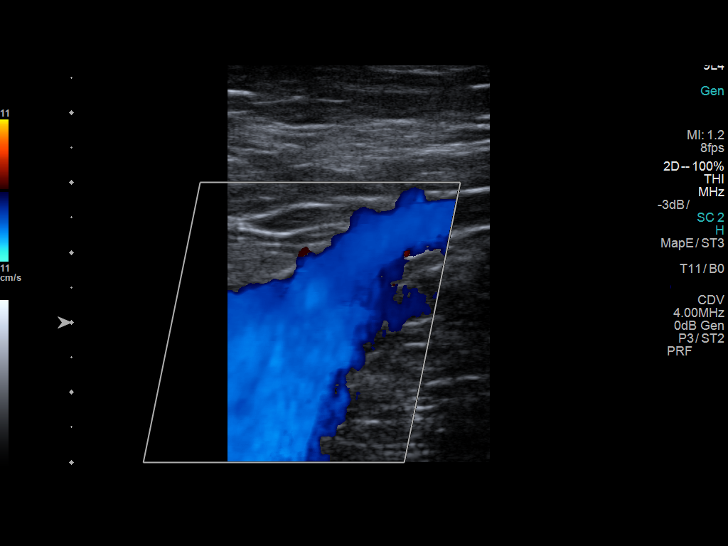
[im 13/36]
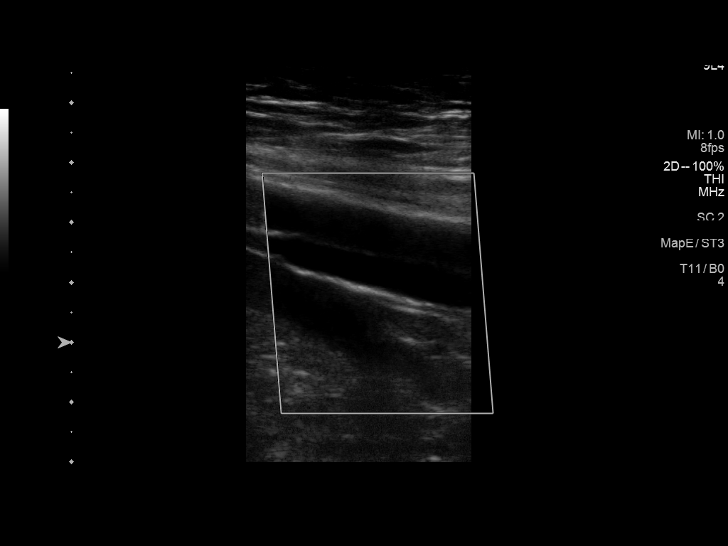
[im 16/36]
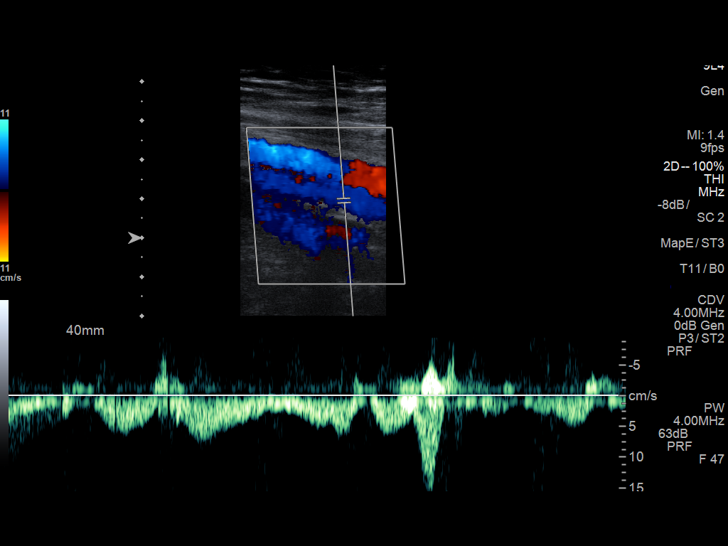
[im 19/36]
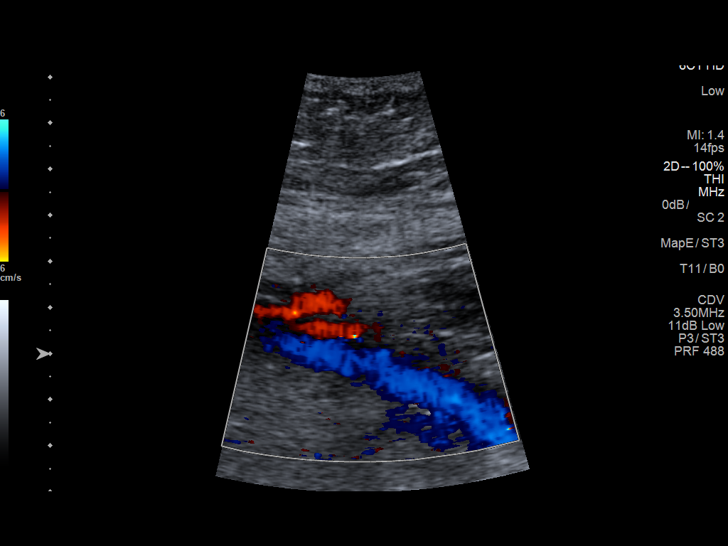
[im 20/36]
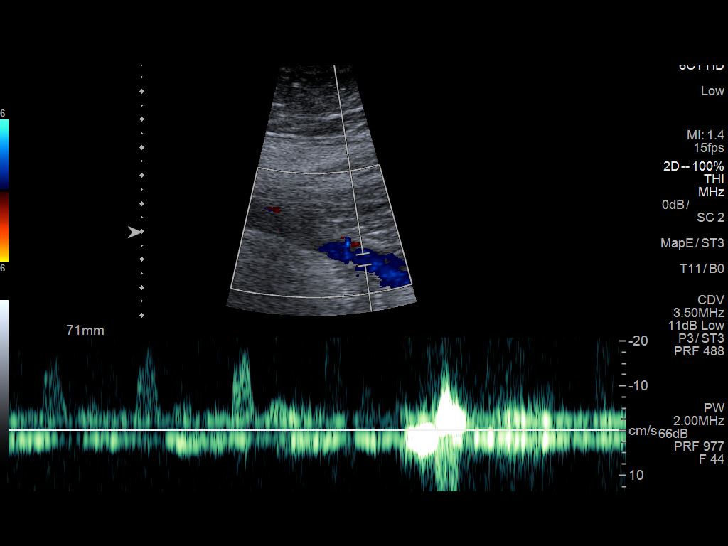
[im 23/36]
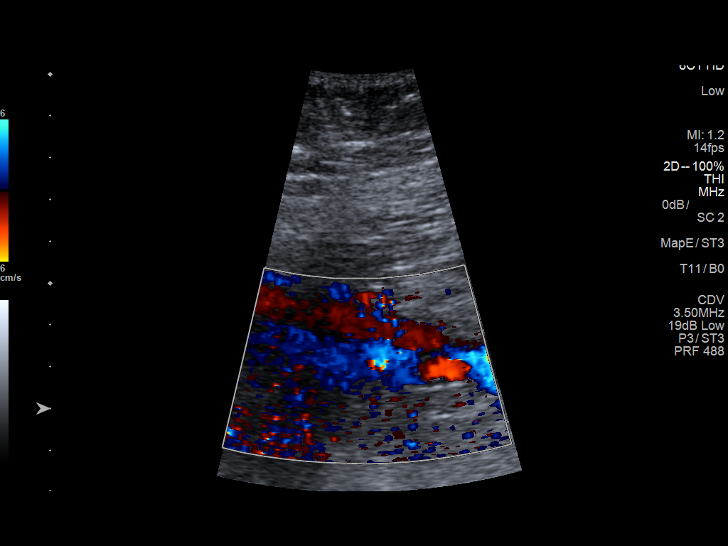
[im 26/36]
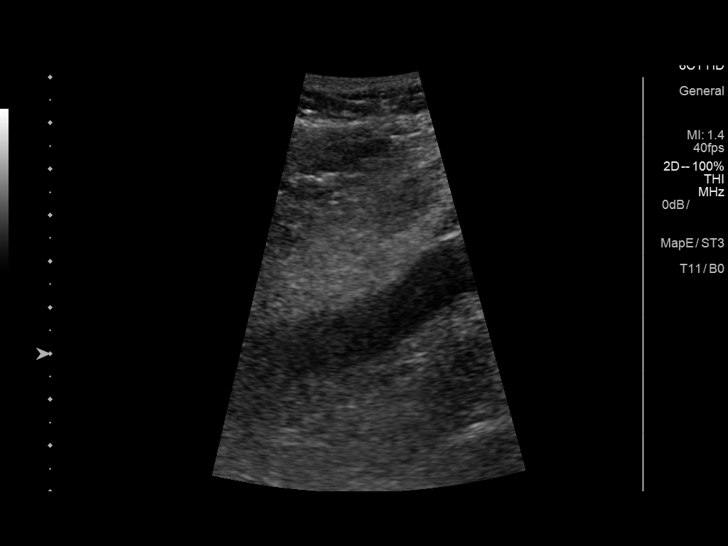
[im 29/36]
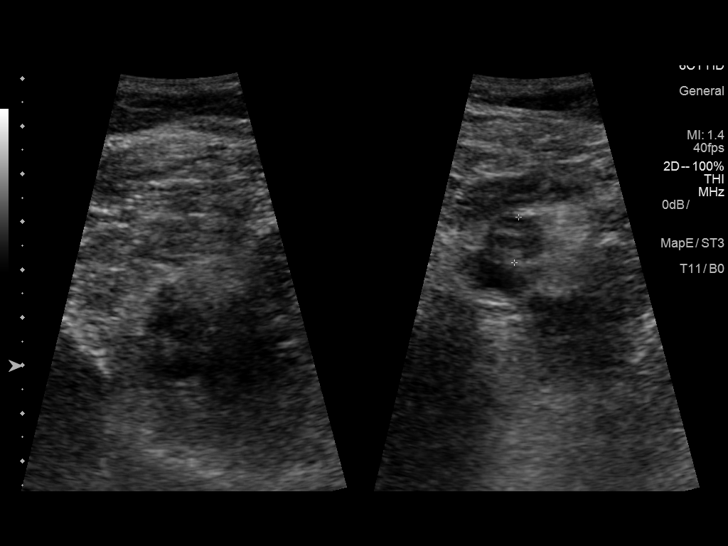
[im 32/36]
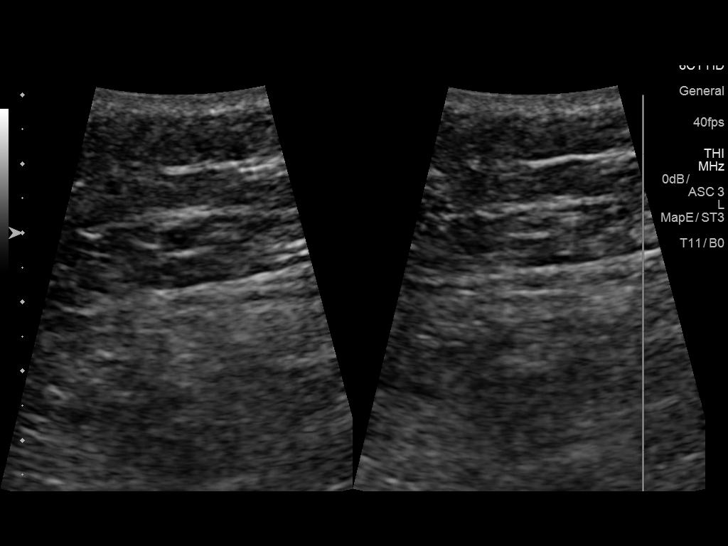
[im 36/36]
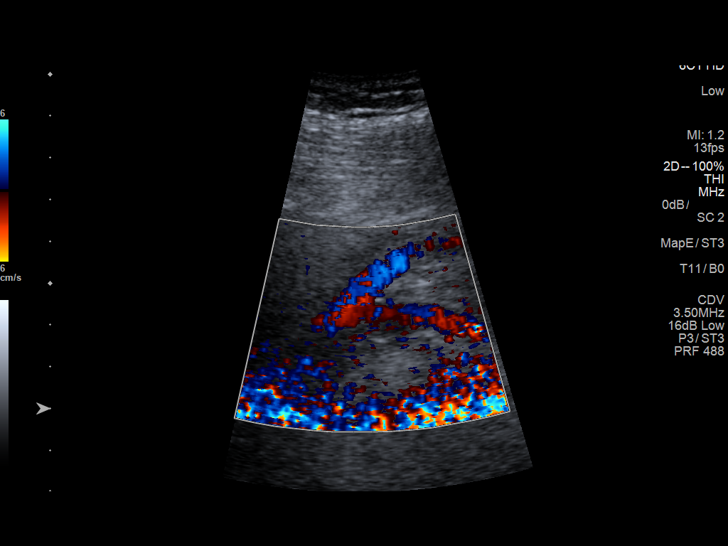

[13 of 24 positions shown; findings below may reference images not displayed]

FINDINGS: Contralateral Common Femoral Vein: Respiratory phasicity is normal
and symmetric with the symptomatic side. No evidence of thrombus.
Normal compressibility.

Common Femoral Vein: No evidence of thrombus. Normal
compressibility, respiratory phasicity and response to augmentation.

Saphenofemoral Junction: No evidence of thrombus. Normal
compressibility and flow on color Doppler imaging.

Profunda Femoral Vein: No evidence of thrombus. Normal
compressibility and flow on color Doppler imaging.

Femoral Vein: No evidence of thrombus. Normal compressibility,
respiratory phasicity and response to augmentation.

Popliteal Vein: There is occlusive thrombus in the left popliteal
vein.

Calf Veins: Limited visualization of calf veins in the lower leg. No
obvious DVT at the level of the calf veins.

Superficial Great Saphenous Vein: No evidence of thrombus. Normal
compressibility.

Venous Reflux:  None.

Other Findings: No evidence of superficial thrombophlebitis or
abnormal fluid collection.
IMPRESSION: Positive for occlusive DVT in the left popliteal vein.

## 2020-03-01 ENCOUNTER — Other Ambulatory Visit: Payer: Self-pay

## 2020-03-01 ENCOUNTER — Encounter: Payer: Self-pay | Admitting: Family Medicine

## 2020-03-01 ENCOUNTER — Telehealth: Payer: Self-pay | Admitting: *Deleted

## 2020-03-01 ENCOUNTER — Telehealth (INDEPENDENT_AMBULATORY_CARE_PROVIDER_SITE_OTHER): Payer: Managed Care, Other (non HMO) | Admitting: Family Medicine

## 2020-03-01 DIAGNOSIS — J019 Acute sinusitis, unspecified: Secondary | ICD-10-CM

## 2020-03-01 DIAGNOSIS — J40 Bronchitis, not specified as acute or chronic: Secondary | ICD-10-CM

## 2020-03-01 IMAGING — CT CT ANGIO CHEST
2 of 7 series · 17 of 46 positions shown · IV contrast (Isovue)
Comparison: Chest x-ray 08/27/2014

CLINICAL DATA: Left lower leg pain history of DVT

EXAM:
CT ANGIOGRAPHY CHEST WITH CONTRAST
TECHNIQUE: Multidetector CT imaging of the chest was performed using the
standard protocol during bolus administration of intravenous
contrast. Multiplanar CT image reconstructions and MIPs were
obtained to evaluate the vascular anatomy.
CONTRAST:  100mL YTJ1RT-G1U IOPAMIDOL (YTJ1RT-G1U) INJECTION 76%

[Series 5: thins · axial · 0.89mm/px · z∈[+1375,+1607]mm · 14 of 264 slices shown]
[im 16/264  lung]
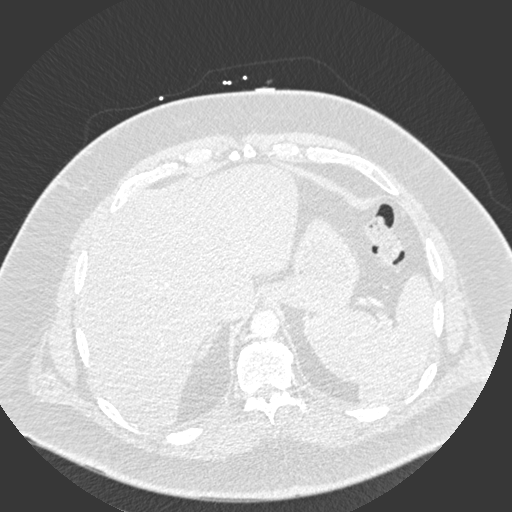
[im 31/264  soft-tissue]
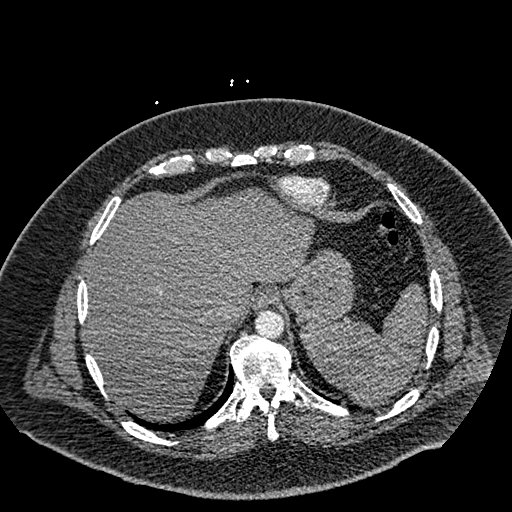
[im 47/264  lung]
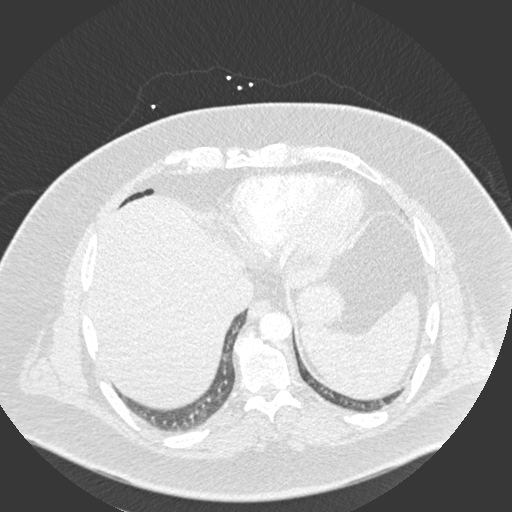
[im 78/264  soft-tissue]
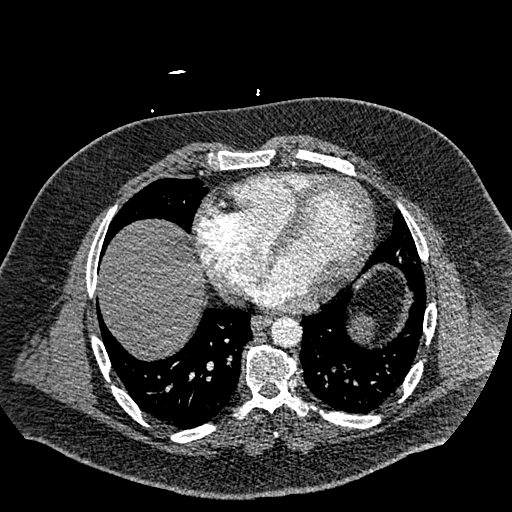
[im 93/264  lung]
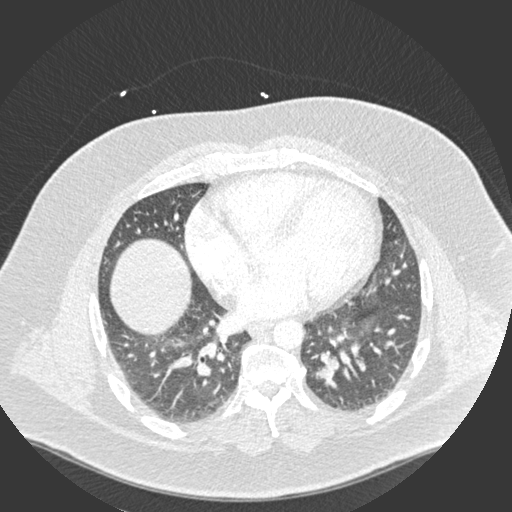
[im 109/264  soft-tissue]
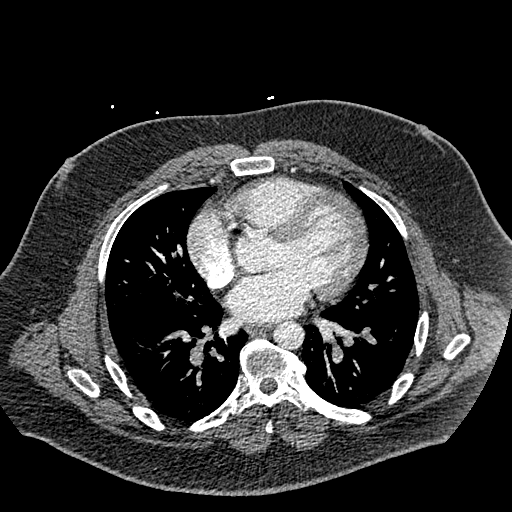
[im 124/264  lung]
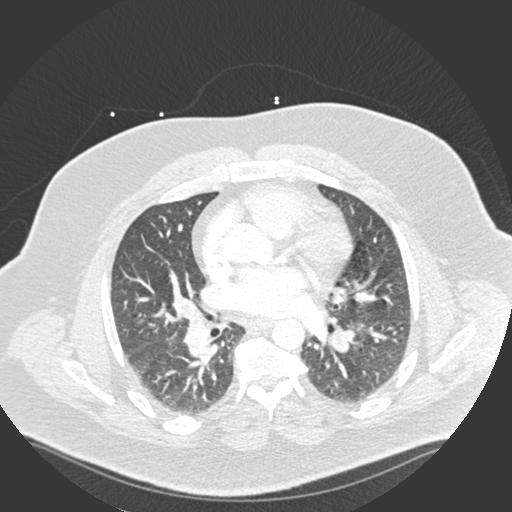
[im 140/264  soft-tissue]
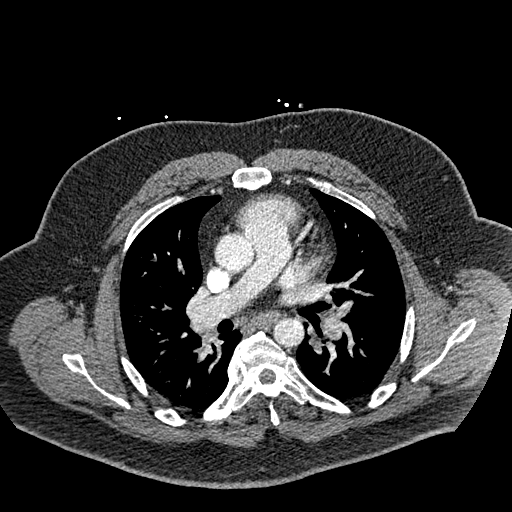
[im 155/264  lung]
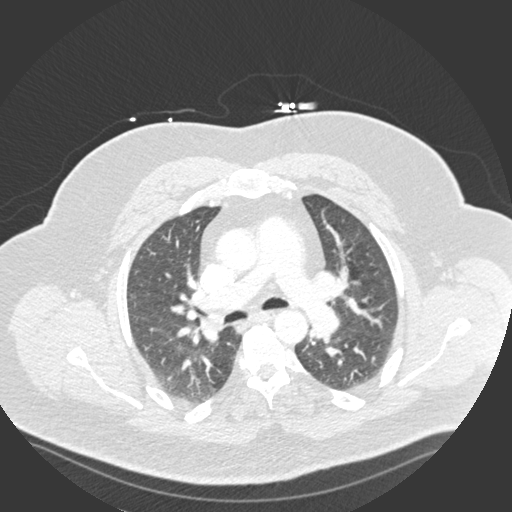
[im 171/264  soft-tissue]
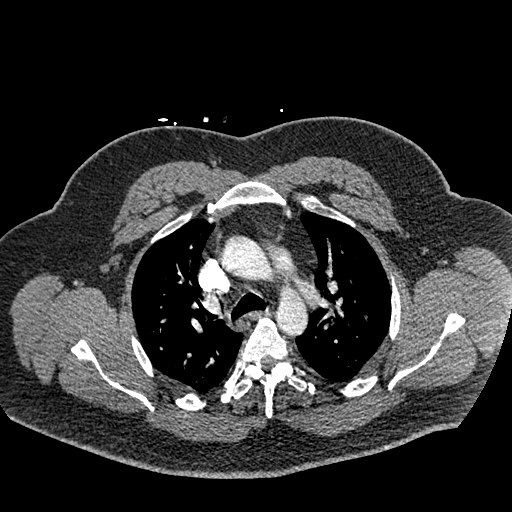
[im 202/264  lung]
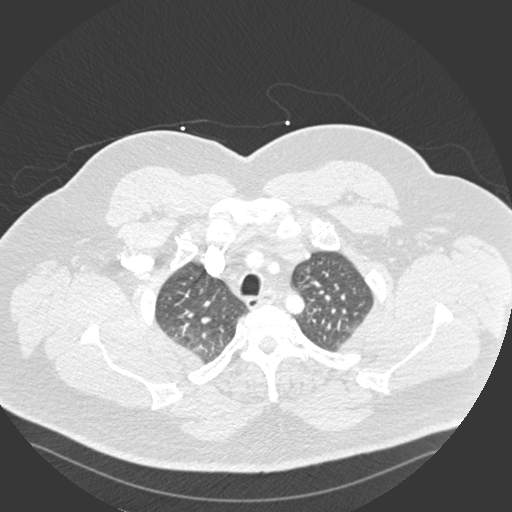
[im 217/264  soft-tissue]
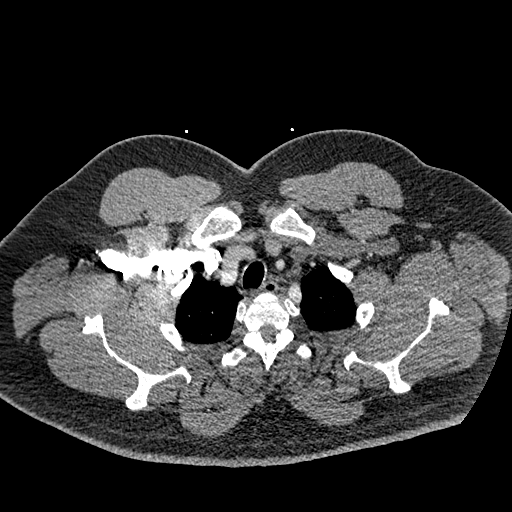
[im 233/264  lung]
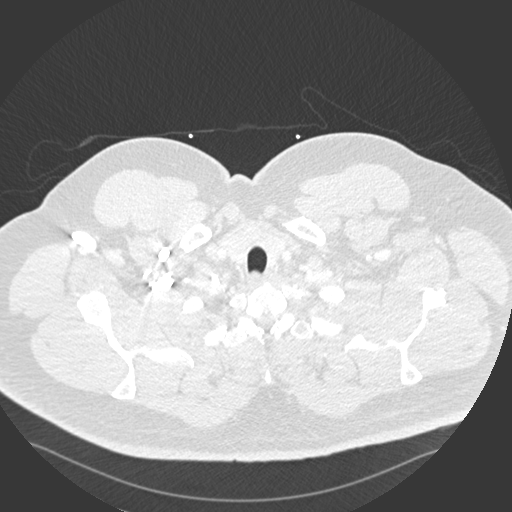
[im 248/264  soft-tissue]
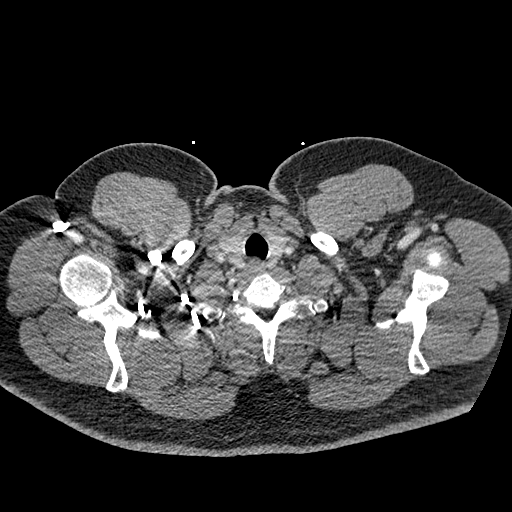

[Series 7: coronal mpr · coronal · 0.55mm/px · 3 of 172 slices shown]
[im 43/172  soft-tissue]
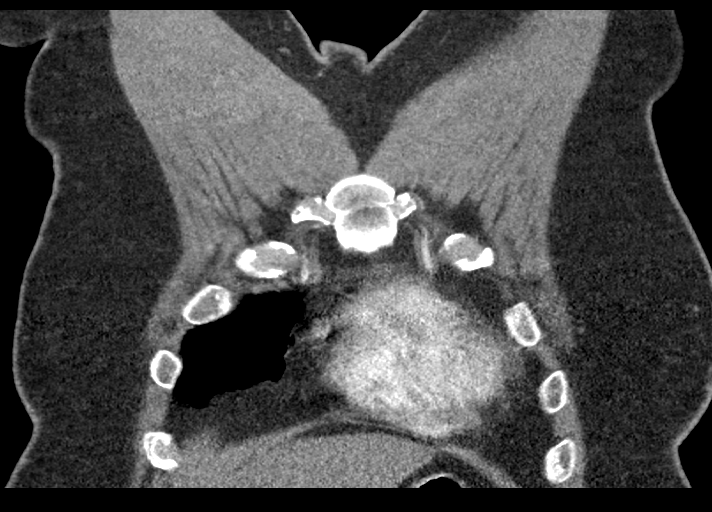
[im 86/172  soft-tissue]
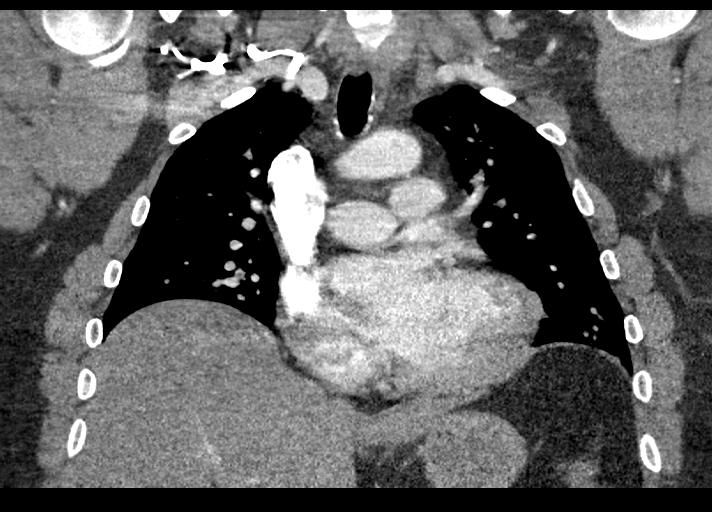
[im 129/172  soft-tissue]
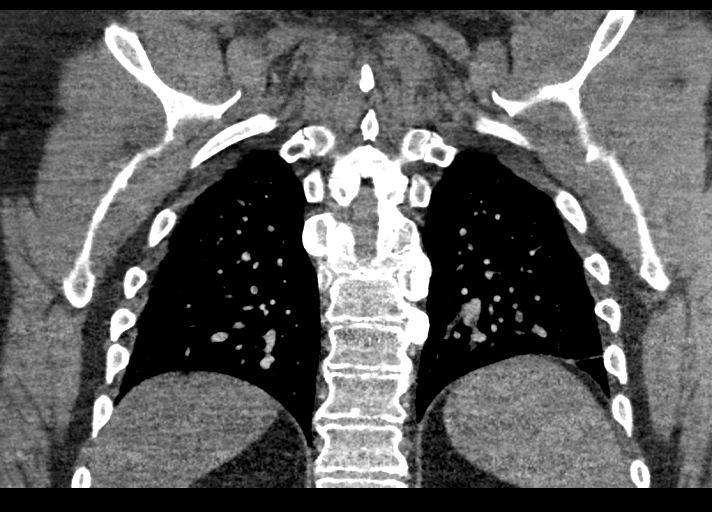

[17 of 46 positions shown; findings below may reference images not displayed]

FINDINGS: Cardiovascular: Suboptimal opacification of the pulmonary arteries
to the segmental level. Filling defects right upper lobe, right
lower lobe and right middle lobe segmental and subsegmental
pulmonary vessels. Additional filling defect within left descending
pulmonary artery and segmental and subsegmental left lower lobe and
lingular branch vessels consistent with acute emboli. RV LV ratio is
non elevated at 0.87. Nonaneurysmal aorta. Borderline cardiomegaly.
No pericardial effusion.

Mediastinum/Nodes: No enlarged mediastinal, hilar, or axillary lymph
nodes. Thyroid gland, trachea, and esophagus demonstrate no
significant findings.

Lungs/Pleura: Lungs are clear. No pleural effusion or pneumothorax.
4 mm left upper lobe pulmonary nodule, series 6, image number 26.

Upper Abdomen: No acute abnormality.

Musculoskeletal: Degenerative changes of the spine. No acute or
suspicious abnormality.

Review of the MIP images confirms the above findings.
IMPRESSION: 1. Limited contrast opacification of pulmonary arterial system,
despite this there are acute bilateral emboli as described above. No
evidence for right heart strain.
2. No acute pulmonary infiltrate
3. 4 mm left upper lobe pulmonary nodule. No follow-up needed if
patient is low-risk. Non-contrast chest CT can be considered in 12
months if patient is high-risk. This recommendation follows the
consensus statement: Guidelines for Management of Incidental
Pulmonary Nodules Detected on CT Images: From the [HOSPITAL]

Critical Value/emergent results were called by telephone at the time
of interpretation on 03/25/2018 at [DATE] to Dr. COREY GRULLON , who
verbally acknowledged these results.

## 2020-03-01 MED ORDER — ALBUTEROL SULFATE HFA 108 (90 BASE) MCG/ACT IN AERS: 2.0000 | INHALATION_SPRAY | Freq: Four times a day (QID) | RESPIRATORY_TRACT | 0 refills | Status: DC | PRN

## 2020-03-01 MED ORDER — AZITHROMYCIN 250 MG PO TABS: ORAL_TABLET | ORAL | 0 refills | Status: DC

## 2020-03-01 NOTE — Progress Notes (Signed)
Patient ID: EANN CLELAND, male    DOB: February 15, 1981, 39 y.o.   MRN: [id number]   Chief Complaint  Patient presents with  . Cough   Subjective:    Cough This is a new problem. Episode onset: 1 week  The cough is non-productive. Associated symptoms include nasal congestion. Associated symptoms comments: Loss of taste and smell. .     Virtual Visit via Video Note  I connected with Pat Kocher on 03/01/20 at  4:00 PM EDT by a video enabled telemedicine application and verified that I am speaking with the correct person using two identifiers.  Location: Patient: home Provider: office    I discussed the limitations of evaluation and management by telemedicine and the availability of in person appointments. The patient expressed understanding and agreed to proceed.  History of Present Illness:    Observations/Objective:   Assessment and Plan:   Follow Up Instructions:    I discussed the assessment and treatment plan with the patient. The patient was provided an opportunity to ask questions and all were answered. The patient agreed with the plan and demonstrated an understanding of the instructions.   The patient was advised to call back or seek an in-person evaluation if the symptoms worsen or if the condition fails to improve as anticipated.  I provided 25 minutes of non-face-to-face time during this encounter.   Medical History Holdyn has a past medical history of Allergic rhinitis, Anxiety, Arrhythmia, Asthma, Kidney stones, and Obesity.   Outpatient Encounter Medications as of 03/01/2020  Medication Sig  . albuterol (VENTOLIN HFA) 108 (90 Base) MCG/ACT inhaler Inhale 2 puffs into the lungs every 6 (six) hours as needed for wheezing or shortness of breath.  Marland Kitchen azithromycin (ZITHROMAX) 250 MG tablet Take two tablets today, then one tablet for next 4 days.  . blood glucose meter kit and supplies Dispense based on patient and insurance preference. Use to check  glucose once daily (FOR ICD-10  E11.9).  . clonazePAM (KLONOPIN) 0.5 MG tablet TAKE 1 TABLET BY MOUTH UP TO TWICE DAILY AS NEEDED. MUST LAST 30 DAYS.  Marland Kitchen escitalopram (LEXAPRO) 20 MG tablet Take 1 tablet (20 mg total) by mouth daily.  Marland Kitchen glipiZIDE (GLUCOTROL) 5 MG tablet Take 1 tablet (5 mg total) by mouth daily before breakfast. (Patient not taking: Reported on 03/01/2020)  . metFORMIN (GLUCOPHAGE) 500 MG tablet 2 TABLETS TWICE DAILY.  . Multiple Vitamin (MULTIVITAMIN WITH MINERALS) TABS tablet Take 1 tablet by mouth daily.  . [DISCONTINUED] oxyCODONE (ROXICODONE) 5 MG immediate release tablet Take 1 tablet (5 mg total) by mouth every 4 (four) hours as needed for severe pain.  . [DISCONTINUED] tamsulosin (FLOMAX) 0.4 MG CAPS capsule Take 1 capsule (0.4 mg total) by mouth daily.   No facility-administered encounter medications on file as of 03/01/2020.     Review of Systems  Respiratory: Positive for cough.   No fever (that he is aware of).  Feels run down. Used neti pot for his sinuses and it helped a little yesterday.    Vitals There were no vitals taken for this visit. This is a video visit. Unable to take vitals. Reports he is not running a fever today. Objective:   Physical Exam Visualized patient on camera. Does not look toxic. He palpated his sinuses: tender to touch especially around eyes and forehead. He palpated his lymph nodes and does not feel tender. He is coughing and wheezing when he lies down. Denies shortness of breath.  Assessment and Plan   1. Acute rhinosinusitis - azithromycin (ZITHROMAX) 250 MG tablet; Take two tablets today, then one tablet for next 4 days.  Dispense: 6 each; Refill: 0  2. Bronchitis with wheezing - albuterol (VENTOLIN HFA) 108 (90 Base) MCG/ACT inhaler; Inhale 2 puffs into the lungs every 6 (six) hours as needed for wheezing or shortness of breath.  Dispense: 8 g; Refill: 0  Instructed [place] on isolation guidelines in case he also has a  viral infection, in light of Covid pandemic.  His wife who is pregnant is also experiencing symptoms, she is getting Covid tested tomorrow.    I recommend supportive therapy to help you to feel better.  1) Get lots of rest.  2) Take over the counter pain medication if needed, such as acetaminophen or ibuprofen. Read and follow instructions on the label and make sure not to combine other medications that may have same ingredients in it. It is important to not take too much of these ingredients.  3) Drink plenty of caffeine-free fluids. (If you have heart or kidney problems, follow the instructions of your specialist regarding amounts).  4) If you are hungry, eat a bland diet, such as the BRAT diet (bananas, rice, applesauce, toast).  5) Let us know if you are not feeling better in a week.  He needs to be seen for his anxiety management. Instructed him to make appointment with Dr. Lovena Le after he recovers for management of his anxiety in person.  Agrees with plan of care discussed today. Understands warning signs to seek further care: shortness of breath. Understands to follow-up if symptoms do not improve or if anything changes.   Chalmers Guest, NP

## 2020-03-01 NOTE — Telephone Encounter (Signed)
[name], petion are scheduled for a virtual visit with your provider today.    Just as we do with appointments in the office, we must obtain your consent to participate.  Your consent will be active for this visit and any virtual visit you may have with one of our providers in the next 365 days.    If you have a MyChart account, I can also send a copy of this consent to you electronically.  All virtual visits are billed to your insurance company just like a traditional visit in the office.  As this is a virtual visit, video technology does not allow for your provider to perform a traditional examination.  This may limit your provider's ability to fully assess your condition.  If your provider identifies any concerns that need to be evaluated in person or the need to arrange testing such as labs, EKG, etc, we will make arrangements to do so.    Although advances in technology are sophisticated, we cannot ensure that it will always work on either your end or our end.  If the connection with a video visit is poor, we may have to switch to a telephone visit.  With either a video or telephone visit, we are not always able to ensure that we have a secure connection.   I need to obtain your verbal consent now.   Are you willing to proceed with your visit today?   Aaron Chambers has provided verbal consent on 03/01/2020 for a virtual visit (video or telephone).   Aaron Rushing, LPN 4/97/0263  7:85 PM

## 2020-03-03 ENCOUNTER — Telehealth: Payer: Self-pay | Admitting: Family Medicine

## 2020-03-03 ENCOUNTER — Encounter: Payer: Self-pay | Admitting: Family Medicine

## 2020-03-03 ENCOUNTER — Other Ambulatory Visit: Payer: Self-pay | Admitting: Family Medicine

## 2020-03-03 DIAGNOSIS — R059 Cough, unspecified: Secondary | ICD-10-CM | POA: Insufficient documentation

## 2020-03-03 MED ORDER — BENZONATATE 100 MG PO CAPS
100.0000 mg | ORAL_CAPSULE | Freq: Two times a day (BID) | ORAL | 0 refills | Status: DC | PRN
Start: 2020-03-03 — End: 2020-03-06

## 2020-03-03 NOTE — Telephone Encounter (Signed)
Wife states patient symptoms are about the same. He is able to sleep through the night without coughing but can't stop during the day. Chest sore from coughing.

## 2020-03-03 NOTE — Telephone Encounter (Signed)
Pt seen by Clydie Braun and cough is worse and would like something called in for cough.   Robbie Lis

## 2020-03-04 ENCOUNTER — Encounter: Payer: Self-pay | Admitting: Family Medicine

## 2020-03-06 ENCOUNTER — Ambulatory Visit
Admission: EM | Admit: 2020-03-06 | Discharge: 2020-03-06 | Disposition: A | Discharge status: Home / Self Care | Payer: Managed Care, Other (non HMO) | Attending: Emergency Medicine | Admitting: Emergency Medicine

## 2020-03-06 ENCOUNTER — Encounter: Payer: Self-pay | Admitting: Emergency Medicine

## 2020-03-06 ENCOUNTER — Other Ambulatory Visit: Payer: Self-pay

## 2020-03-06 DIAGNOSIS — Z1152 Encounter for screening for COVID-19: Secondary | ICD-10-CM

## 2020-03-06 DIAGNOSIS — J069 Acute upper respiratory infection, unspecified: Secondary | ICD-10-CM

## 2020-03-06 HISTORY — DX: Type 2 diabetes mellitus without complications: E11.9

## 2020-03-06 MED ORDER — BENZONATATE 100 MG PO CAPS
100.0000 mg | ORAL_CAPSULE | Freq: Three times a day (TID) | ORAL | 0 refills | Status: DC
Start: 2020-03-06 — End: 2020-04-13

## 2020-03-06 MED ORDER — ALBUTEROL SULFATE (2.5 MG/3ML) 0.083% IN NEBU: 2.5000 mg | INHALATION_SOLUTION | Freq: Four times a day (QID) | RESPIRATORY_TRACT | 12 refills | Status: DC | PRN

## 2020-03-06 NOTE — ED Provider Notes (Signed)
[facility]   [id number] 03/06/20 Arrival Time: 0915   CC: COVID symptoms  SUBJECTIVE: History from: patient.  LAWARENCE Chambers is a 39 y.o. male who presented to the urgent care with a complaint of cough and shortness of breath for the past 7 days.  Wife has tested positive for COVID-19.  Denies sick exposure to , flu or strep.  Denies recent travel.  Has tried OTC medication without relief.  Denies aggravating or alleviating factors.  Denies previous symptoms in the past.   Denies fever, chills, fatigue, sinus pain, rhinorrhea, sore throat, wheezing, chest pain, nausea, changes in bowel or bladder habits.     ROS: As per HPI.  All other pertinent ROS negative.     Past Medical History:  Diagnosis Date   Allergic rhinitis    Anxiety    Arrhythmia    Asthma    Diabetes mellitus without complication ([place])    Kidney stones    Obesity    Past Surgical History:  Procedure Laterality Date   CARDIAC ELECTROPHYSIOLOGY STUDY AND ABLATION     VASCULAR SURGERY     No Known Allergies No current facility-administered medications on file prior to encounter.   Current Outpatient Medications on File Prior to Encounter  Medication Sig Dispense Refill   azithromycin (ZITHROMAX) 250 MG tablet Take two tablets today, then one tablet for next 4 days. 6 each 0   blood glucose meter kit and supplies Dispense based on patient and insurance preference. Use to check glucose once daily (FOR ICD-10  E11.9). 1 each 5   clonazePAM (KLONOPIN) 0.5 MG tablet TAKE 1 TABLET BY MOUTH UP TO TWICE DAILY AS NEEDED. MUST LAST 30 DAYS. 30 tablet 5   escitalopram (LEXAPRO) 20 MG tablet Take 1 tablet (20 mg total) by mouth daily. 30 tablet 5   glipiZIDE (GLUCOTROL) 5 MG tablet Take 1 tablet (5 mg total) by mouth daily before breakfast. (Patient not taking: Reported on 03/01/2020) 30 tablet 5   metFORMIN (GLUCOPHAGE) 500 MG tablet 2 TABLETS TWICE DAILY. 120 tablet 5   Multiple Vitamin  (MULTIVITAMIN WITH MINERALS) TABS tablet Take 1 tablet by mouth daily.     Social History   Socioeconomic History   Marital status: Married    Spouse name: Not on file   Number of children: Not on file   Years of education: Not on file   Highest education level: Not on file  Occupational History   Not on file  Tobacco Use   Smoking status: Never Smoker   Smokeless tobacco: Current User    Types: Snuff  Substance and Sexual Activity   Alcohol use: No   Drug use: No   Sexual activity: Not on file  Other Topics Concern   Not on file  Social History Narrative   Not on file   Social Determinants of Health   Financial Resource Strain:    Difficulty of Paying Living Expenses: Not on file  Food Insecurity:    Worried About [place] in the Last Year: Not on file   Ran Out of Food in the Last Year: Not on file  Transportation Needs:    Lack of Transportation (Medical): Not on file   Lack of Transportation (Non-Medical): Not on file  Physical Activity:    Days of Exercise per Week: Not on file   Minutes of Exercise per Session: Not on file  Stress:    Feeling of Stress : Not on file  Social  Connections:    Frequency of Communication with Friends and Family: Not on file   Frequency of Social Gatherings with Friends and Family: Not on file   Attends Religious Services: Not on file   Active Member of Clubs or Organizations: Not on file   Attends Archivist Meetings: Not on file   Marital Status: Not on file  Intimate Partner Violence:    Fear of Current or Ex-Partner: Not on file   Emotionally Abused: Not on file   Physically Abused: Not on file   Sexually Abused: Not on file   No family history on file.  OBJECTIVE:  Vitals:   03/06/20 0935  BP: 122/84  Pulse: 88  Resp: (!) 24  Temp: 99.1 F (37.3 C)  TempSrc: Oral  SpO2: 90%  Weight: (!) 379 lb 3.1 oz (172 kg)  Height: $Remove'6\' 4"'eEBAsAs$  (1.93 m)     General appearance:  alert; appears fatigued, but nontoxic; speaking in full sentences and tolerating own secretions HEENT: NCAT; Ears: EACs clear, TMs pearly gray; Eyes: PERRL.  EOM grossly intact. Sinuses: nontender; Nose: nares patent without rhinorrhea, Throat: oropharynx clear, tonsils non erythematous or enlarged, uvula midline  Neck: supple without LAD Lungs: unlabored respirations, symmetrical air entry; cough: moderate; no respiratory distress; CTAB Heart: regular rate and rhythm.  Radial pulses 2+ symmetrical bilaterally Skin: warm and dry Psychological: alert and cooperative; normal mood and affect  LABS:  No results found for this or any previous visit (from the past 24 hour(s)).   ASSESSMENT & PLAN:  1. Viral URI with cough   2. Encounter for screening for COVID-19     Meds ordered this encounter  Medications   benzonatate (TESSALON) 100 MG capsule    Sig: Take 1 capsule (100 mg total) by mouth every 8 (eight) hours.    Dispense:  21 capsule    Refill:  0   albuterol (PROVENTIL) (2.5 MG/3ML) 0.083% nebulizer solution    Sig: Take 3 mLs (2.5 mg total) by nebulization every 6 (six) hours as needed for wheezing or shortness of breath.    Dispense:  75 mL    Refill:  12    Discharge Instructions  COVID testing ordered.  It will take between 4-7 days for test results.  Someone will contact you regarding abnormal results.    In the meantime: You should remain isolated in your home for 10 days from symptom onset AND greater than 72 hours after symptoms resolution (absence of fever without the use of fever-reducing medication and improvement in respiratory symptoms), whichever is longer Get plenty of rest and push fluids Tessalon Perles prescribed for cough Albuterol was prescribed for home nebulizer Use medications daily for symptom relief Use OTC medications like ibuprofen or tylenol as needed fever or pain Call or go to the ED if you have any new or worsening symptoms such as fever,  worsening cough, shortness of breath, chest tightness, chest pain, turning blue, changes in mental status, etc...   Reviewed expectations re: course of current medical issues. Questions answered. Outlined signs and symptoms indicating need for more acute intervention. Patient verbalized understanding. After Visit Summary given.      Note: This document was prepared using Dragon voice recognition software and may include unintentional dictation errors.    Emerson Monte, [place] 03/06/20 [phone]

## 2020-03-06 NOTE — ED Triage Notes (Addendum)
Pt's wife positive for covid recently, pt has cough that is worse when he first gets up in the morning.  Pt did not have the vaccine.

## 2020-03-06 NOTE — Discharge Instructions (Addendum)
COVID testing ordered.  It will take between 4-7 days for test results.  Someone will contact you regarding abnormal results.    In the meantime: You should remain isolated in your home for 10 days from symptom onset AND greater than 72 hours after symptoms resolution (absence of fever without the use of fever-reducing medication and improvement in respiratory symptoms), whichever is longer Get plenty of rest and push fluids Tessalon Perles prescribed for cough Albuterol was prescribed for home nebulizer Use medications daily for symptom relief Use OTC medications like ibuprofen or tylenol as needed fever or pain Call or go to the ED if you have any new or worsening symptoms such as fever, worsening cough, shortness of breath, chest tightness, chest pain, turning blue, changes in mental status, etc..Marland Kitchen

## 2020-03-08 LAB — NOVEL CORONAVIRUS, NAA: SARS-CoV-2, NAA: DETECTED — AB

## 2020-03-09 ENCOUNTER — Telehealth: Payer: Self-pay | Admitting: Oncology

## 2020-03-09 NOTE — Telephone Encounter (Signed)
Called to Discuss with patient about Covid symptoms and the use of regeneron, a monoclonal antibody infusion for those with mild to moderate Covid symptoms and at a high risk of hospitalization.     Patient is feeling much better.  He believes he is greater than 2 weeks out.  He no longer meets criteria.   Mignon Pine, AGNP [phone] (Infusion Center Hotline)

## 2020-03-23 ENCOUNTER — Telehealth: Payer: Self-pay | Admitting: Family Medicine

## 2020-03-23 MED ORDER — ESCITALOPRAM OXALATE 20 MG PO TABS: 20.0000 mg | ORAL_TABLET | Freq: Every day | ORAL | 0 refills | Status: DC

## 2020-03-23 MED ORDER — CLONAZEPAM 0.5 MG PO TABS: ORAL_TABLET | ORAL | 0 refills | Status: DC

## 2020-03-23 NOTE — Telephone Encounter (Signed)
Patient is requesting refill on escitalopram 20mg ,clonazepam 0.5 mg. He states completely out has appointment for 10/5 for medication follow up. [facility]

## 2020-04-13 ENCOUNTER — Ambulatory Visit (INDEPENDENT_AMBULATORY_CARE_PROVIDER_SITE_OTHER): Payer: Self-pay | Admitting: Family Medicine

## 2020-04-13 ENCOUNTER — Encounter: Payer: Self-pay | Admitting: Family Medicine

## 2020-04-13 ENCOUNTER — Other Ambulatory Visit: Payer: Self-pay

## 2020-04-13 VITALS — BP 130/80 | HR 85 | Temp 98.4°F | Wt 378.8 lb

## 2020-04-13 DIAGNOSIS — Z79899 Other long term (current) drug therapy: Secondary | ICD-10-CM

## 2020-04-13 DIAGNOSIS — F419 Anxiety disorder, unspecified: Secondary | ICD-10-CM

## 2020-04-13 DIAGNOSIS — F5101 Primary insomnia: Secondary | ICD-10-CM

## 2020-04-13 DIAGNOSIS — E1165 Type 2 diabetes mellitus with hyperglycemia: Secondary | ICD-10-CM

## 2020-04-13 LAB — POCT GLYCOSYLATED HEMOGLOBIN (HGB A1C): Hemoglobin A1C: 7.7 % — AB (ref 4.0–5.6)

## 2020-04-13 MED ORDER — ESCITALOPRAM OXALATE 20 MG PO TABS: 20.0000 mg | ORAL_TABLET | Freq: Every day | ORAL | 1 refills | Status: DC

## 2020-04-13 MED ORDER — GLUCOSE BLOOD VI STRP: ORAL_STRIP | 3 refills | Status: AC

## 2020-04-13 MED ORDER — CLONAZEPAM 0.5 MG PO TABS: ORAL_TABLET | ORAL | 2 refills | Status: DC

## 2020-04-13 MED ORDER — METFORMIN HCL 500 MG PO TABS: ORAL_TABLET | ORAL | 2 refills | Status: DC

## 2020-04-13 NOTE — Progress Notes (Signed)
 Patient ID: Aaron Chambers, male    DOB: 08/11/1980, 38 y.o.   MRN: [id number]   Chief Complaint  Patient presents with  . Anxiety   Subjective:    HPI Pt doing well.  No new concerns. Working a lot and now just on one job now.  Had covid- in 8/21. Was mild and severe coughing.  But resolved. Used albuterol and cough syrup. Had high fever up to 104.5F.  Was able to get it under control with fever reducers. Then the coughing improved.  Anxiety- Taking lexapro 20mg daily.  Feels it's helping. Has been on it since 2018. Having a baby soon, first child. Feeling anxious. Clonazepam- taking it 2x per day. Since 2013.  Some days feeling more relaxed when taking it. Anxious with hand tingling in hands/feet. Taking the medication 1x per day. occ needing it 2x per day.  Used to see Dr. Cradenzo, pcp, then came to Dr. Steve.  DM2- taking metformin 1000mg bid. And glipizide 5mg in am. 100-115, seeing bg. After getting covid- the bg was up and down. Last a1c 9/20- was 8.7 Needing more test strips. accucheck aviva plus Testing once daily.   Medical History Jorge has a past medical history of Allergic rhinitis, Anxiety, Arrhythmia, Asthma, Diabetes mellitus without complication (HCC), Kidney stones, and Obesity.   Outpatient Encounter Medications as of 04/13/2020  Medication Sig  . albuterol (PROVENTIL) (2.5 MG/3ML) 0.083% nebulizer solution Take 3 mLs (2.5 mg total) by nebulization every 6 (six) hours as needed for wheezing or shortness of breath. (Patient not taking: Reported on 04/13/2020)  . blood glucose meter kit and supplies Dispense based on patient and insurance preference. Use to check glucose once daily (FOR ICD-10  E11.9).  . clonazePAM (KLONOPIN) 0.5 MG tablet TAKE 1 TABLET BY MOUTH UP TO TWICE DAILY AS NEEDED. MUST LAST 30 DAYS.  . escitalopram (LEXAPRO) 20 MG tablet Take 1 tablet (20 mg total) by mouth daily.  . glipiZIDE (GLUCOTROL) 5 MG tablet Take 1 tablet (5  mg total) by mouth daily before breakfast. (Patient not taking: Reported on 03/01/2020)  . glucose blood test strip Test glucose 1x per day.  . metFORMIN (GLUCOPHAGE) 500 MG tablet 2 TABLETS TWICE DAILY.  . Multiple Vitamin (MULTIVITAMIN WITH MINERALS) TABS tablet Take 1 tablet by mouth daily.  . [DISCONTINUED] azithromycin (ZITHROMAX) 250 MG tablet Take two tablets today, then one tablet for next 4 days.  . [DISCONTINUED] benzonatate (TESSALON) 100 MG capsule Take 1 capsule (100 mg total) by mouth every 8 (eight) hours.  . [DISCONTINUED] clonazePAM (KLONOPIN) 0.5 MG tablet TAKE 1 TABLET BY MOUTH UP TO TWICE DAILY AS NEEDED. MUST LAST 30 DAYS.  . [DISCONTINUED] escitalopram (LEXAPRO) 20 MG tablet Take 1 tablet (20 mg total) by mouth daily.  . [DISCONTINUED] metFORMIN (GLUCOPHAGE) 500 MG tablet 2 TABLETS TWICE DAILY.   No facility-administered encounter medications on file as of 04/13/2020.     Review of Systems  Constitutional: Negative for chills and fever.  HENT: Negative for congestion, rhinorrhea and sore throat.   Respiratory: Negative for cough, shortness of breath and wheezing.   Cardiovascular: Negative for chest pain and leg swelling.  Gastrointestinal: Negative for abdominal pain, diarrhea, nausea and vomiting.  Genitourinary: Negative for dysuria and frequency.  Skin: Negative for rash.  Neurological: Negative for dizziness, weakness and headaches.     Vitals BP 130/80   Pulse 85   Temp 98.4 F (36.9 C)   Wt (!) 378 lb 12.8 oz (171.8   kg)   SpO2 97%   BMI 46.11 kg/m   Objective:   Physical Exam Vitals and nursing note reviewed.  Constitutional:      General: He is not in acute distress.    Appearance: Normal appearance. He is not ill-appearing.  HENT:     Head: Normocephalic.     Nose: Nose normal. No congestion.     Mouth/Throat:     Mouth: Mucous membranes are moist.     Pharynx: No oropharyngeal exudate.  Eyes:     Extraocular Movements: Extraocular  movements intact.     Conjunctiva/sclera: Conjunctivae normal.     Pupils: Pupils are equal, round, and reactive to light.  Cardiovascular:     Rate and Rhythm: Normal rate and regular rhythm.     Pulses: Normal pulses.     Heart sounds: Normal heart sounds. No murmur heard.   Pulmonary:     Effort: Pulmonary effort is normal.     Breath sounds: Normal breath sounds. No wheezing, rhonchi or rales.  Musculoskeletal:        General: Normal range of motion.     Right lower leg: No edema.     Left lower leg: No edema.  Skin:    General: Skin is warm and dry.     Findings: No rash.  Neurological:     General: No focal deficit present.     Mental Status: He is alert and oriented to person, place, and time.     Cranial Nerves: No cranial nerve deficit.  Psychiatric:        Mood and Affect: Mood normal.        Behavior: Behavior normal.        Thought Content: Thought content normal.        Judgment: Judgment normal.      Assessment and Plan   1. Anxiety disorder, unspecified type - escitalopram (LEXAPRO) 20 MG tablet; Take 1 tablet (20 mg total) by mouth daily.  Dispense: 90 tablet; Refill: 1 - clonazePAM (KLONOPIN) 0.5 MG tablet; TAKE 1 TABLET BY MOUTH UP TO TWICE DAILY AS NEEDED. MUST LAST 30 DAYS.  Dispense: 30 tablet; Refill: 2  2. High risk medication use - ToxASSURE Select 13 (MW), Urine  3. Type 2 diabetes mellitus with hyperglycemia, without long-term current use of insulin (HCC) - CBC - CMP14+EGFR - Lipid panel - Microalbumin, urine - metFORMIN (GLUCOPHAGE) 500 MG tablet; 2 TABLETS TWICE DAILY.  Dispense: 120 tablet; Refill: 2 - POCT glycosylated hemoglobin (Hb A1C)  4. Primary insomnia - clonazePAM (KLONOPIN) 0.5 MG tablet; TAKE 1 TABLET BY MOUTH UP TO TWICE DAILY AS NEEDED. MUST LAST 30 DAYS.  Dispense: 30 tablet; Refill: 2   DM2- stable, cont meds. Cont to watch carbs. a1c- 7.7 today. Sent in refill for test strips.   Anxiety/insomnia- stable.  cont lexapro  and clonazepam. Has had increase anxiety around birth of new baby coming up.  Pt to think about increasing 6m lexapro.  Will call if feeling needing to inc from 241mto 3053mablets.  F/u [id number]

## 2020-04-14 LAB — MED LIST OPTION NOT SELECTED

## 2020-04-16 LAB — SPECIMEN STATUS REPORT

## 2020-04-16 LAB — TOXASSURE SELECT 13 (MW), URINE

## 2020-06-01 ENCOUNTER — Other Ambulatory Visit: Payer: Self-pay

## 2020-06-01 ENCOUNTER — Encounter: Payer: Self-pay | Admitting: Family Medicine

## 2020-06-01 ENCOUNTER — Ambulatory Visit (INDEPENDENT_AMBULATORY_CARE_PROVIDER_SITE_OTHER): Payer: Self-pay | Admitting: Family Medicine

## 2020-06-01 VITALS — BP 134/82 | HR 76 | Temp 97.4°F | Ht 76.0 in | Wt 392.0 lb

## 2020-06-01 DIAGNOSIS — R4586 Emotional lability: Secondary | ICD-10-CM

## 2020-06-01 DIAGNOSIS — F419 Anxiety disorder, unspecified: Secondary | ICD-10-CM

## 2020-06-01 MED ORDER — LAMOTRIGINE 25 MG PO TABS: ORAL_TABLET | ORAL | 0 refills | Status: DC

## 2020-06-01 NOTE — Progress Notes (Signed)
Patient ID: Aaron Chambers, male    DOB: [birth year], 39 y.o.   MRN: [id number]   Chief Complaint  Patient presents with  . Anxiety   Subjective:    HPI  follow up on anxiety and depression. Has been more moody lately. Was doing counseling before he lost insurance and before covid pandemic. Will get insurance in January.   Pt having some swing shifts. Not wanting to talk to anyone and just irritable. Not caring about anything.  Is newly married and going to have a baby soon.  This is first child.  Other days waking up fine, but can have a few days per week not able to control his moods. Can be mid day and moods can change irritable and angry at times.  Intermittently happening, but is increasing in frequency since our last visit 1 mo ago. Pt reports his father was same way, not sure if he had a mental disorder, but he would go in basement and not talk to anyone for 1-2 wks.   Pt is overweight and used to have last pcp to tell him to work on it. Feeling trying at times to work on it then his emotions/moods gets in the way. Was 260-270lbs.  Now at 392lbs.  Was able to get motivated to do it in the past. worreid his weight is problem but motivation. Feeling his moods variable and cause him to go back to eating badly.   Stress about new baby coming. At times getting excited then can feel overwhelmed feelings. New baby, for him and his wife.   Used to see counselor before pandemic.  Never was SI, but at that time felt really down.  Sat on bed with gun at that time. Then started seeing a counselor. Never had that before or after.  Pt tried meds to help with inc sperm count.  Then pt got a blood clot and other conditions that led to worsening depression/anxiety.  Used to see Nutritionist and Fish farm manager. Was expensive, not able to afford at this time. Paying self pay at this time.  For anxiety went down to 0.$RemoveB'5mg'eMBEuIYL$  tablet, however, pt reporting 1-2 x per week needing 1.$RemoveBefore'5mg'NGnOEWPvBOrzu$   klonapin at a time due to not getting any relief with 1 tablet.   Medical History Aaron Chambers has a past medical history of Allergic rhinitis, Anxiety, Arrhythmia, Asthma, Diabetes mellitus without complication ([place]), Kidney stones, and Obesity.   Outpatient Encounter Medications as of 06/01/2020  Medication Sig  . albuterol (PROVENTIL) (2.5 MG/3ML) 0.083% nebulizer solution Take 3 mLs (2.5 mg total) by nebulization every 6 (six) hours as needed for wheezing or shortness of breath.  . blood glucose meter kit and supplies Dispense based on patient and insurance preference. Use to check glucose once daily (FOR ICD-10  E11.9).  . clonazePAM (KLONOPIN) 0.5 MG tablet TAKE 1 TABLET BY MOUTH UP TO TWICE DAILY AS NEEDED. MUST LAST 30 DAYS.  Marland Kitchen escitalopram (LEXAPRO) 20 MG tablet Take 1 tablet (20 mg total) by mouth daily.  Marland Kitchen glipiZIDE (GLUCOTROL) 5 MG tablet Take 1 tablet (5 mg total) by mouth daily before breakfast.  . glucose blood test strip Test glucose 1x per day.  . metFORMIN (GLUCOPHAGE) 500 MG tablet 2 TABLETS TWICE DAILY.  . Multiple Vitamin (MULTIVITAMIN WITH MINERALS) TABS tablet Take 1 tablet by mouth daily.  Marland Kitchen lamoTRIgine (LAMICTAL) 25 MG tablet Take 1 tab p.o. daily for 14 days, then increase to 2 tab daily.   No facility-administered encounter medications on  file as of 06/01/2020.     Review of Systems  Constitutional: Negative for chills and fever.  HENT: Negative for congestion, rhinorrhea and sore throat.   Respiratory: Negative for cough, shortness of breath and wheezing.   Cardiovascular: Negative for chest pain and leg swelling.  Gastrointestinal: Negative for abdominal pain, diarrhea, nausea and vomiting.  Genitourinary: Negative for dysuria and frequency.  Skin: Negative for rash.  Neurological: Negative for dizziness, weakness and headaches.  Psychiatric/Behavioral: Positive for agitation and dysphoric mood. Negative for behavioral problems, confusion, decreased  concentration, self-injury, sleep disturbance and suicidal ideas. The patient is nervous/anxious.      Vitals BP 134/82   Pulse 76   Temp (!) 97.4 F (36.3 C)   Ht $R'6\' 4"'dC$  (1.93 m)   Wt (!) 392 lb (177.8 kg)   SpO2 99%   BMI 47.72 kg/m   Objective:   Physical Exam Vitals and nursing note reviewed.  Constitutional:      General: He is not in acute distress.    Appearance: Normal appearance. He is obese. He is not ill-appearing.  HENT:     Head: Normocephalic.     Nose: Nose normal. No congestion.     Mouth/Throat:     Mouth: Mucous membranes are moist.     Pharynx: No oropharyngeal exudate.  Eyes:     Extraocular Movements: Extraocular movements intact.     Conjunctiva/sclera: Conjunctivae normal.     Pupils: Pupils are equal, round, and reactive to light.  Cardiovascular:     Rate and Rhythm: Normal rate and regular rhythm.     Pulses: Normal pulses.     Heart sounds: Normal heart sounds. No murmur heard.   Pulmonary:     Effort: Pulmonary effort is normal.     Breath sounds: Normal breath sounds. No wheezing, rhonchi or rales.  Musculoskeletal:        General: Normal range of motion.     Right lower leg: No edema.     Left lower leg: No edema.  Skin:    General: Skin is warm and dry.     Findings: No rash.  Neurological:     General: No focal deficit present.     Mental Status: He is alert and oriented to person, place, and time.     Cranial Nerves: No cranial nerve deficit.  Psychiatric:        Behavior: Behavior normal.        Thought Content: Thought content normal.        Judgment: Judgment normal.     Comments: +anxious mood      Assessment and Plan   1. Anxiety disorder, unspecified type  2. Morbid obesity (HCC)  3. Labile mood   anxiety- -pt taking 1.$RemoveBefo'5mg'EPPomuHwJKD$  tab klonapin q 3x per wk. Script written for 0.$RemoveBefor'5mg'EUOOqCJYKgKZ$  klonapin bid. Depression/anxiety- cont with Lexapro $RemoveBefo'20mg'YRObiusRpWA$ . Mood disturbance - Start the lamictal $RemoveBefo'25mg'BqNzKiKSpFx$  daily for 2 wks then inc to $Remo'50mg'RLFUN$   daily. Reviewed need to continue with counseling to help with treatment.  Pt having cost concerns at this time till the new year.  Would recommend pt needing to see psychiatry, but at this time pt has no insurance.  Planning on getting insurance in Jan '22.  Then next visit discuss weight gain concerns.  F/u 4 wks to recheck depr/anxiety/mood.

## 2020-06-29 ENCOUNTER — Other Ambulatory Visit: Payer: Self-pay

## 2020-06-29 ENCOUNTER — Telehealth (INDEPENDENT_AMBULATORY_CARE_PROVIDER_SITE_OTHER): Payer: Self-pay | Admitting: Family Medicine

## 2020-06-29 DIAGNOSIS — F419 Anxiety disorder, unspecified: Secondary | ICD-10-CM

## 2020-06-29 MED ORDER — LAMOTRIGINE 25 MG PO TABS: 50.0000 mg | ORAL_TABLET | Freq: Every day | ORAL | 2 refills | Status: DC

## 2020-06-29 MED ORDER — CLONAZEPAM 0.5 MG PO TABS: ORAL_TABLET | ORAL | 2 refills | Status: DC

## 2020-06-29 NOTE — Progress Notes (Signed)
Patient ID: Aaron Chambers, male    DOB: 02-13-1981, 39 y.o.   MRN: [id number]   Chief Complaint  Patient presents with  . Anxiety   Subjective:    HPI  follow up on anxiety/agitation. Pt states doing better since adding lamictal.  Took about 1.5 wks to see a difference. Pt went from $RemoveB'25mg'xqSqwDCA$  to $R'50mg'aD$  lamictal daily. Some daysoccassionally getting "cranky" but now more manageable.  Anger and agitation more controlled.  Pt on lexapro $RemoveBe'20mg'aFljxjJqs$ . Not dwelling on things and more "even feeling."  Takes more to get him really upset and in better control of his anger/agitation. Still taking some clonazepam 0.$RemoveBeforeDEI'5mg'IftUFEtjdNrlRZnT$  bid prn. When having anxiety tingling in hands/feet. Not having to use it as much.  Pt running his own business.  Feeling better over the past month with the new medication added, lamictal.    Medical History Aaron Chambers has a past medical history of Allergic rhinitis, Anxiety, Arrhythmia, Asthma, Diabetes mellitus without complication ([place]), Kidney stones, and Obesity.   Outpatient Encounter Medications as of 06/29/2020  Medication Sig  . albuterol (PROVENTIL) (2.5 MG/3ML) 0.083% nebulizer solution Take 3 mLs (2.5 mg total) by nebulization every 6 (six) hours as needed for wheezing or shortness of breath.  . blood glucose meter kit and supplies Dispense based on patient and insurance preference. Use to check glucose once daily (FOR ICD-10  E11.9).  Marland Kitchen escitalopram (LEXAPRO) 20 MG tablet Take 1 tablet (20 mg total) by mouth daily.  Marland Kitchen glipiZIDE (GLUCOTROL) 5 MG tablet Take 1 tablet (5 mg total) by mouth daily before breakfast.  . glucose blood test strip Test glucose 1x per day.  . metFORMIN (GLUCOPHAGE) 500 MG tablet 2 TABLETS TWICE DAILY.  . Multiple Vitamin (MULTIVITAMIN WITH MINERALS) TABS tablet Take 1 tablet by mouth daily.  . [DISCONTINUED] clonazePAM (KLONOPIN) 0.5 MG tablet TAKE 1 TABLET BY MOUTH UP TO TWICE DAILY AS NEEDED. MUST LAST 30 DAYS.  . [DISCONTINUED] lamoTRIgine  (LAMICTAL) 25 MG tablet Take 1 tab p.o. daily for 14 days, then increase to 2 tab daily.  . clonazePAM (KLONOPIN) 0.5 MG tablet TAKE 1 TABLET BY MOUTH UP TO TWICE DAILY AS NEEDED. MUST LAST 30 DAYS.  Marland Kitchen lamoTRIgine (LAMICTAL) 25 MG tablet Take 2 tablets (50 mg total) by mouth daily. Take 1 tab p.o. daily for 14 days, then increase to 2 tab daily.   No facility-administered encounter medications on file as of 06/29/2020.     Review of Systems  Constitutional: Negative for chills and fever.  HENT: Negative for congestion, rhinorrhea and sore throat.   Respiratory: Negative for cough, shortness of breath and wheezing.   Cardiovascular: Negative for chest pain and leg swelling.  Gastrointestinal: Negative for abdominal pain, diarrhea, nausea and vomiting.  Genitourinary: Negative for dysuria and frequency.  Skin: Negative for rash.  Neurological: Negative for dizziness, weakness and headaches.  Psychiatric/Behavioral: Positive for agitation (controlled, with meds). Negative for dysphoric mood, self-injury, sleep disturbance and suicidal ideas. The patient is nervous/anxious (controlled with meds).     Vitals There were no vitals taken for this visit.  Objective:   Physical Exam  No PE, due to phone visit. Assessment and Plan   1. Anxiety disorder, unspecified type - lamoTRIgine (LAMICTAL) 25 MG tablet; Take 2 tablets (50 mg total) by mouth daily. Take 1 tab p.o. daily for 14 days, then increase to 2 tab daily.  Dispense: 60 tablet; Refill: 2 - clonazePAM (KLONOPIN) 0.5 MG tablet; TAKE 1 TABLET BY MOUTH UP TO  TWICE DAILY AS NEEDED. MUST LAST 30 DAYS.  Dispense: 30 tablet; Refill: 2   Pt to cont with lexapro $RemoveBefo'20mg'BBjhWtfidWA$  has refills.  Will send refills for lamictal and klonapin.  F/u 36mo or prn.  Virtual Visit via Telephone Note  I connected with Aaron Chambers on 06/30/20 at  9:40 AM EST by telephone and verified that I am speaking with the correct person using two  identifiers.  Location: Patient: home Provider: office   I discussed the limitations, risks, security and privacy concerns of performing an evaluation and management service by telephone and the availability of in person appointments. I also discussed with the patient that there may be a patient responsible charge related to this service. The patient expressed understanding and agreed to proceed.     Follow Up Instructions:    I discussed the assessment and treatment plan with the patient. The patient was provided an opportunity to ask questions and all were answered. The patient agreed with the plan and demonstrated an understanding of the instructions.   The patient was advised to call back or seek an in-person evaluation if the symptoms worsen or if the condition fails to improve as anticipated.  I provided 15 minutes of non-face-to-face time during this encounter.   [place], DO

## 2020-07-14 ENCOUNTER — Ambulatory Visit: Payer: Self-pay | Admitting: Family Medicine

## 2020-07-30 ENCOUNTER — Telehealth: Payer: Self-pay | Admitting: Family Medicine

## 2020-07-30 DIAGNOSIS — F419 Anxiety disorder, unspecified: Secondary | ICD-10-CM

## 2020-07-30 NOTE — Telephone Encounter (Signed)
Patient is requesting a 90 day supply on clonazepam 0.5 mg instead of every month he has to call and get a refill on this medication.Please advise [facility]

## 2020-07-31 MED ORDER — CLONAZEPAM 0.5 MG PO TABS: ORAL_TABLET | ORAL | 0 refills | Status: DC

## 2020-07-31 NOTE — Telephone Encounter (Signed)
Pt was given 30 days with 2 refills, so that is a 90 day supply.  I don't give 3 month supplies of controlled medications.   Dr. Ladona Ridgel

## 2020-08-02 NOTE — Telephone Encounter (Signed)
Patient notified and scheduled follow up office visit. 

## 2020-08-10 ENCOUNTER — Other Ambulatory Visit: Payer: Self-pay | Admitting: Family Medicine

## 2020-08-10 DIAGNOSIS — E1165 Type 2 diabetes mellitus with hyperglycemia: Secondary | ICD-10-CM

## 2020-08-18 ENCOUNTER — Ambulatory Visit: Payer: Self-pay | Admitting: Family Medicine

## 2020-08-18 ENCOUNTER — Encounter: Payer: Self-pay | Admitting: Family Medicine

## 2020-09-13 ENCOUNTER — Telehealth: Payer: Self-pay | Admitting: Family Medicine

## 2020-09-13 DIAGNOSIS — E1165 Type 2 diabetes mellitus with hyperglycemia: Secondary | ICD-10-CM

## 2020-09-13 DIAGNOSIS — Z13220 Encounter for screening for lipoid disorders: Secondary | ICD-10-CM

## 2020-09-13 DIAGNOSIS — Z79899 Other long term (current) drug therapy: Secondary | ICD-10-CM

## 2020-09-14 ENCOUNTER — Other Ambulatory Visit: Payer: Self-pay

## 2020-09-14 ENCOUNTER — Ambulatory Visit: Payer: Self-pay | Admitting: Family Medicine

## 2020-09-14 NOTE — Telephone Encounter (Signed)
Pt missed appt today. Please reschedule

## 2020-09-14 NOTE — Telephone Encounter (Signed)
Patient will be transferring his records

## 2020-09-14 NOTE — Telephone Encounter (Signed)
Lab Results  Component Value Date   HGBA1C 7.7 (A) 04/13/2020    Lab Results  Component Value Date   CREATININE 1.08 11/23/2019     Lab Results  Component Value Date   CHOL 192 02/25/2019   HDL 42 02/25/2019   LDLCALC 118 (H) 02/25/2019   TRIG 159 (H) 02/25/2019   CHOLHDL 4.6 02/25/2019     BP Readings from Last 3 Encounters:  06/01/20 134/82  04/13/20 130/80  03/06/20 122/84    Lab Results  Component Value Date   HGBA1C 7.7 (A) 04/13/2020

## 2020-09-14 NOTE — Telephone Encounter (Signed)
Please contact patient to have him set up appt. Thank you! 

## 2020-09-14 NOTE — Telephone Encounter (Signed)
Patient had appointment on 3/8

## 2020-09-14 NOTE — Addendum Note (Signed)
Addended by: Marlowe Shores on: 09/14/2020 09:58 AM   Modules accepted: Orders

## 2020-09-14 NOTE — Telephone Encounter (Signed)
Needs appt to f/u on diabetes, and needs labs. In next 30 days. pls order cmp, lipids, and a1c, prior to his appt.   Thx. Dr. Ladona Ridgel

## 2021-02-10 ENCOUNTER — Encounter (INDEPENDENT_AMBULATORY_CARE_PROVIDER_SITE_OTHER): Payer: Self-pay | Admitting: *Deleted

## 2021-03-10 ENCOUNTER — Ambulatory Visit (INDEPENDENT_AMBULATORY_CARE_PROVIDER_SITE_OTHER): Payer: Self-pay | Admitting: Gastroenterology

## 2021-03-15 ENCOUNTER — Other Ambulatory Visit: Payer: Self-pay

## 2021-03-15 ENCOUNTER — Encounter (INDEPENDENT_AMBULATORY_CARE_PROVIDER_SITE_OTHER): Payer: Self-pay | Admitting: Gastroenterology

## 2021-03-15 ENCOUNTER — Ambulatory Visit (INDEPENDENT_AMBULATORY_CARE_PROVIDER_SITE_OTHER): Payer: Self-pay | Admitting: Gastroenterology

## 2021-03-15 VITALS — BP 135/86 | HR 66 | Temp 98.6°F | Ht 76.0 in | Wt 379.2 lb

## 2021-03-15 DIAGNOSIS — K760 Fatty (change of) liver, not elsewhere classified: Secondary | ICD-10-CM

## 2021-03-15 DIAGNOSIS — R7989 Other specified abnormal findings of blood chemistry: Secondary | ICD-10-CM

## 2021-03-15 NOTE — Progress Notes (Signed)
Referring Provider: Erven Colla, DO Primary Care Physician:  [place], MD Primary GI Physician: Jenetta Downer  Chief Complaint  Patient presents with   elevated lft's    Patient is here today to follow up on elevated Lft's. He has no current GI issues. He has 1-2 bm's per day and has a good appetite.   HPI:   Aaron Chambers is a 40 y.o. male with past medical history of anxiety, arrythmia, asthma, DM, obesity, elevated LFTs.  Patient presenting today for follow up of elevated LFTs. Last seen in our clinic in 05/28/19.  Elevated LFTs: patient initially referred to [place] for elevated LFTs with transaminases elevated as high as AST 116 and ALT 164.  No recent labs for evaluation of LFTs, last labs reviewable, 11/23/19 with AST WNL, ALT 66, alk phos and t bili WNL.  Abdominal US with elastography 03/26/19 F3+F4, high risk of fibrosis/cirrhosis.  Labs 05/28/19 revealed negative ANA, IgG, AMA, ASMA, ceruloplasm, hepatic steatosis likely related to fatty infiltration of liver as patient does not drink alcohol.  Hep A and B Ab non reactive, patient previously advised to obtain Hep A and B vaccines. No evidence of Hep C  He is exercising and was recently started on ozempic for his DM by his PCP, no longer on glipizide, is still on metformin. He is trying to manage portion sizes, eating 3 well rounded meals a day with increased vegetables.   He does report some goody powder use, maybe 3-4x/month, no other NSAIDs.  No red flag symptoms. Patient denies melena, hematochezia, nausea, vomiting, diarrhea, constipation, dysphagia, odyonophagia, early satiety or unintentional weight loss.   Social: no tobacco or alcohol intake Familial: no CRC or liver disease  Last Colonoscopy:n/a  Last Endoscopy:n/a  Recommendations:  Initial screening colonoscopy at 5, unless clinically indicated sooner Consider EGD for evaluation of presence of esophageal varices if elastography shows  progression to cirrhosis.  Past Medical History:  Diagnosis Date   Allergic rhinitis    Anxiety    Arrhythmia    Asthma    Diabetes mellitus without complication ([place])    Kidney stones    Obesity     Past Surgical History:  Procedure Laterality Date   CARDIAC ELECTROPHYSIOLOGY STUDY AND ABLATION     VASCULAR SURGERY      Current Outpatient Medications  Medication Sig Dispense Refill   albuterol (PROVENTIL) (2.5 MG/3ML) 0.083% nebulizer solution Take 3 mLs (2.5 mg total) by nebulization every 6 (six) hours as needed for wheezing or shortness of breath. 75 mL 12   blood glucose meter kit and supplies Dispense based on patient and insurance preference. Use to check glucose once daily (FOR ICD-10  E11.9). 1 each 5   clonazePAM (KLONOPIN) 0.5 MG tablet TAKE 1 TABLET BY MOUTH UP TO TWICE DAILY AS NEEDED. MUST LAST 30 DAYS. 30 tablet 0   escitalopram (LEXAPRO) 20 MG tablet Take 1 tablet (20 mg total) by mouth daily. 90 tablet 1   glucose blood test strip Test glucose 1x per day. 100 each 3   lamoTRIgine (LAMICTAL) 25 MG tablet Take 2 tablets (50 mg total) by mouth daily. Take 1 tab p.o. daily for 14 days, then increase to 2 tab daily. 60 tablet 2   metFORMIN (GLUCOPHAGE) 500 MG tablet TAKE 2 TABLETS BY MOUTH TWICE DAILY. 120 tablet 0   Multiple Vitamin (MULTIVITAMIN WITH MINERALS) TABS tablet Take 1 tablet by mouth daily.     Semaglutide,0.25 or 0.5MG/DOS, (OZEMPIC, 0.25  OR 0.5 MG/DOSE,) 2 MG/1.5ML SOPN Inject into the skin once a week.     No current facility-administered medications for this visit.    Allergies as of 03/15/2021   (No Known Allergies)    History reviewed. No pertinent family history.  Social History   Socioeconomic History   Marital status: Married    Spouse name: Not on file   Number of children: Not on file   Years of education: Not on file   Highest education level: Not on file  Occupational History   Not on file  Tobacco Use   Smoking status: Never    Smokeless tobacco: Current    Types: Snuff  Vaping Use   Vaping Use: Never used  Substance and Sexual Activity   Alcohol use: No   Drug use: No   Sexual activity: Not on file  Other Topics Concern   Not on file  Social History Narrative   Not on file   Social Determinants of Health   Financial Resource Strain: Not on file  Food Insecurity: Not on file  Transportation Needs: Not on file  Physical Activity: Not on file  Stress: Not on file  Social Connections: Not on file    Review of Systems: Gen: Denies fever, chills, anorexia. Denies fatigue, weakness, weight loss.  CV: Denies chest pain, palpitations, syncope, peripheral edema, and claudication. Resp: Denies dyspnea at rest, cough, wheezing, coughing up blood, and pleurisy. GI: Denies vomiting blood, jaundice, and fecal incontinence. Denies dysphagia or odynophagia. Derm: Denies rash, itching, dry skin Psych: Denies depression, anxiety, memory loss, confusion. No homicidal or suicidal ideation.  Heme: Denies bruising, bleeding, and enlarged lymph nodes.  Physical Exam: BP 135/86 (BP Location: Left Arm, Patient Position: Sitting, Cuff Size: Large)   Pulse 66   Temp 98.6 F (37 C) (Oral)   Ht $R'6\' 4"'yk$  (1.93 m)   Wt (!) 379 lb 3.2 oz (172 kg)   BMI 46.16 kg/m  General:   Alert and oriented. No distress noted. Pleasant and cooperative.  Head:  Normocephalic and atraumatic. Eyes:  Conjuctiva clear without scleral icterus. Mouth:  Oral mucosa pink and moist. Good dentition. No lesions. Heart: Normal rate and rhythm, s1 and s2 heart sounds present.  Lungs: Clear lung sounds in all lobes. Respirations equal and unlabored. Abdomen:  +BS, soft, non-tender and non-distended. No rebound or guarding. No HSM or masses noted. Derm: No palmar erythema or jaundice Msk:  Symmetrical without gross deformities. Normal posture. Extremities:  Without edema. Neurologic:  Alert and  oriented x4 Psych:  Alert and cooperative. Normal mood  and affect.  Invalid input(s): 6 MONTHS   ASSESSMENT: Aaron Chambers is a 40 y.o. male presenting today for follow up of elevated LFTs and fatty liver disease. Last seen in our clinic 05/28/19 for elevated transaminases after routine blood work done by PCP, transaminases initially as high as AST 116 and ALT 164 then.  last labs reviewable, 11/23/19 with AST WNL, ALT 66, alk phos and t bili WNL. Abdominal US with elastography 03/26/19 F3+F4, high risk of fibrosis/cirrhosis.  Previously negative Hep C (w/o antibodies), ANA, IgG, AMA, ASMA, ceruloplasm in Nov 2020.  hepatic steatosis likely related to fatty infiltration of liver as patient does not drink alcohol.   We discussed the importance of diet and exercise which patient is working diligently on. He is trying to eat 3 well rounded, portion controlled meals per day. Recently switched to ozempic for his DM by new PCP and states this  has helped curb his appetite. We also discussed the importance of 6 month follow ups with our clinic to better manage fatty liver/liver fibrosis. We will update CBC, CMP, INR and Fibrosure today as well as Liver US elastography as patient has had no recent labs and elastography was done last in 2020. Patient is amenable to plan of care.   We should consider EGD for evaluation of presence of esophageal varices if Liver US reveals progression to cirrhosis/concern for portal hypertension.   PLAN:  Check cbc, cmp, inr, fibrosure 2. Will update US liver elastography 3. Continue diet and exercise 4. Continue to avoid alcohol, NSAIDs 5. Consider EGD for eval of presence of esophageal varices if updated Liver US concerning for progression to cirrhosis  Follow Up: 6 months  Tacha Manni L. Alver Sorrow, MSN, APRN, AGNP-C Adult-Gerontology Nurse Practitioner [facility]

## 2021-03-15 NOTE — Patient Instructions (Signed)
We will update your labs today. We will schedule you for an [place] of your liver since the last one was 2 years ago. Please continue to avoid alcohol, NSAIDs and continue diet and exercise as you are doing.  Follow up with [place] in 6 months

## 2021-03-28 ENCOUNTER — Other Ambulatory Visit: Payer: Self-pay

## 2021-03-28 ENCOUNTER — Ambulatory Visit (HOSPITAL_COMMUNITY)
Admission: RE | Admit: 2021-03-28 | Discharge: 2021-03-28 | Disposition: A | Discharge status: Home / Self Care | Payer: Managed Care, Other (non HMO) | Source: Ambulatory Visit | Attending: Gastroenterology | Admitting: Gastroenterology

## 2021-03-28 DIAGNOSIS — K760 Fatty (change of) liver, not elsewhere classified: Secondary | ICD-10-CM | POA: Insufficient documentation

## 2021-03-28 DIAGNOSIS — R7989 Other specified abnormal findings of blood chemistry: Secondary | ICD-10-CM | POA: Insufficient documentation

## 2021-09-15 ENCOUNTER — Other Ambulatory Visit: Payer: Self-pay

## 2021-09-15 ENCOUNTER — Encounter (INDEPENDENT_AMBULATORY_CARE_PROVIDER_SITE_OTHER): Payer: Self-pay | Admitting: Gastroenterology

## 2021-09-15 ENCOUNTER — Ambulatory Visit (INDEPENDENT_AMBULATORY_CARE_PROVIDER_SITE_OTHER): Payer: 59 | Admitting: Gastroenterology

## 2021-09-15 VITALS — BP 150/87 | HR 91 | Temp 98.0°F | Ht 76.0 in | Wt 372.0 lb

## 2021-09-15 DIAGNOSIS — K7581 Nonalcoholic steatohepatitis (NASH): Secondary | ICD-10-CM

## 2021-09-15 DIAGNOSIS — R7989 Other specified abnormal findings of blood chemistry: Secondary | ICD-10-CM

## 2021-09-15 NOTE — Patient Instructions (Addendum)
Perform blood workup ?Referral to [facility] for bariatric surgery evaluation ?Continue follow up with PCP regarding Ozempic and diabetes control ?Explained to the patient the etiology and consequences of his current liver disease. Patient was counseled about the benefit of implementing a Mediterranean diet and exercise plan to decrease at least 5% of weight. The patient understood about the importance of lifestyle changes to potentially reverse his liver involvement. ?

## 2021-09-15 NOTE — Progress Notes (Signed)
Maylon Peppers, M.D. ?Gastroenterology & Hepatology ?[facility] ?[address] ?[place], [place] [address] ? ?Primary Care Physician: ?[place], MD ?[address] ?[place] [address] ? ?I will communicate my assessment and recommendations to the referring MD via EMR. ? 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 ?History of Present Illness: ?Aaron Chambers is a 41 y.o. male with past medical history of anxiety, asthma, diabetes, obesity and NASH, who presents for follow up of NASH. ? ?The patient was last seen on 03/15/2021. At that time, the patient had CBC, CMP, INR and fibrosure checked but patient did not perform these tests.  Liver elastography was repeated on 03/28/2021 which showed kPa of 15.1 but this was limited due to the body habitus of the patient. ? ?Patient reports that he has questions about his umbilical hernia. Has presented some dicomfort around the area. No changes in the color of the skin. States he has had to strain more frequently to have bowel movements. He has had [place] 2-3 consistency in his bowel movements. This started 4 months ago.  States very occasionally he sees some streaks of blood. The patient denies having any nausea, vomiting, fever, chills, hematochezia, melena, hematemesis, abdominal distention, abdominal pain, diarrhea, jaundice, pruritus. ? ?Has been trying to work in his diet and has been Ozempic 0.5 mg qweek for 3-4 months. Since last visit he has lost 7 pounds. ? ?The patient denies having any nausea, vomiting, fever, chills, hematochezia, melena, hematemesis, abdominal distention, abdominal pain, diarrhea, jaundice, pruritus or weight loss. ? ?Last EGD: never ?Last Colonoscopy: never ? ?Past Medical History: ?Past Medical History:  ?Diagnosis Date  ? Allergic rhinitis   ? Anxiety   ? Arrhythmia   ? Asthma   ? Diabetes mellitus without complication ([place])   ? Kidney stones   ? Obesity    ? ? ?Past Surgical History: ?Past Surgical History:  ?Procedure Laterality Date  ? CARDIAC ELECTROPHYSIOLOGY STUDY AND ABLATION    ? VASCULAR SURGERY    ? ? ?Family History:History reviewed. No pertinent family history. ? ?Social History: ?Social History  ? ?Tobacco Use  ?Smoking Status Never  ?Smokeless Tobacco Current  ? Types: Snuff  ? ?Social History  ? ?Substance and Sexual Activity  ?Alcohol Use No  ? ?Social History  ? ?Substance and Sexual Activity  ?Drug Use No  ? ? ?Allergies: ?No Known Allergies ? ?Medications: ?Current Outpatient Medications  ?Medication Sig Dispense Refill  ? albuterol (PROVENTIL) (2.5 MG/3ML) 0.083% nebulizer solution Take 3 mLs (2.5 mg total) by nebulization every 6 (six) hours as needed for wheezing or shortness of breath. 75 mL 12  ? blood glucose meter kit and supplies Dispense based on patient and insurance preference. Use to check glucose once daily (FOR ICD-10  E11.9). 1 each 5  ? clonazePAM (KLONOPIN) 0.5 MG tablet TAKE 1 TABLET BY MOUTH UP TO TWICE DAILY AS NEEDED. MUST LAST 30 DAYS. 30 tablet 0  ? escitalopram (LEXAPRO) 20 MG tablet Take 1 tablet (20 mg total) by mouth daily. (Patient taking differently: Take 30 mg by mouth daily.) 90 tablet 1  ? glucose blood test strip Test glucose 1x per day. 100 each 3  ? lamoTRIgine (LAMICTAL) 25 MG tablet Take 2 tablets (50 mg total) by mouth daily. Take 1 tab p.o. daily for 14 days, then increase to 2 tab daily. 60 tablet 2  ? metFORMIN (GLUCOPHAGE) 500 MG tablet TAKE 2 TABLETS BY MOUTH  TWICE DAILY. 120 tablet 0  ? Multiple Vitamin (MULTIVITAMIN WITH MINERALS) TABS tablet Take 1 tablet by mouth daily.    ? Semaglutide,0.25 or 0.5MG /DOS, (OZEMPIC, 0.25 OR 0.5 MG/DOSE,) 2 MG/1.5ML SOPN Inject into the skin once a week.    ? ?No current facility-administered medications for this visit.  ? ? ?Review of Systems: ?GENERAL: negative for malaise, night sweats ?HEENT: No changes in hearing or vision, no nose bleeds or other nasal  problems. ?NECK: Negative for lumps, goiter, pain and significant neck swelling ?RESPIRATORY: Negative for cough, wheezing ?CARDIOVASCULAR: Negative for chest pain, leg swelling, palpitations, orthopnea ?GI: SEE HPI ?MUSCULOSKELETAL: Negative for joint pain or swelling, back pain, and muscle pain. ?SKIN: Negative for lesions, rash ?PSYCH: Negative for sleep disturbance, mood disorder and recent psychosocial stressors. ?HEMATOLOGY Negative for prolonged bleeding, bruising easily, and swollen nodes. ?ENDOCRINE: Negative for cold or heat intolerance, polyuria, polydipsia and goiter. ?NEURO: negative for tremor, gait imbalance, syncope and seizures. ?The remainder of the review of systems is noncontributory. ? ? ?Physical Exam: ?BP (!) 150/87 (BP Location: Left Arm, Patient Position: Sitting, Cuff Size: Large)   Pulse 91   Temp 98 ?F (36.7 ?C) (Oral)   Ht 6\' 4"  (1.93 m)   Wt (!) 372 lb (168.7 kg)   BMI 45.28 kg/m?  ?GENERAL: The patient is AO x3, in no acute distress. Obese. ?HEENT: Head is normocephalic and atraumatic. EOMI are intact. Mouth is well hydrated and without lesions. ?NECK: Supple. No masses ?LUNGS: Clear to auscultation. No presence of rhonchi/wheezing/rales. Adequate chest expansion ?HEART: RRR, normal s1 and s2. ?ABDOMEN: Soft, nontender, no guarding, no peritoneal signs, and nondistended. BS +. No masses. ?EXTREMITIES: Without any cyanosis, clubbing, rash, lesions or edema. ?NEUROLOGIC: AOx3, no focal motor deficit. ?SKIN: no jaundice, no rashes ? ?Imaging/Labs: ?as above ? ?I personally reviewed and interpreted the available labs, imaging and endoscopic files. ? ?Impression and Plan: ?Aaron Chambers is a 41 y.o. male with past medical history of anxiety, asthma, diabetes, obesity and NASH, who presents for follow up of NASH.  Had evidence of possible advanced liver fibrosis involves an elastography although the studies quality may not be the best given his body habitus.  Nevertheless, he is  very concerned about his weight gain and the fact that this has led to NASH.  After thorough discussion, it was decided that he will benefit to be evaluated by bariatric surgery to determine if he is eligible for weight loss surgery.  He would like to hear about the options available for this and the benefits from the different options.  I think this will be useful to we will control his diabetes and his obesity, while it may lead to fibrosis/steatosis regression.  We will check MELD labs today and CBC, as well as a fibrotest.  He will benefit from implementing a Mediterranean diet for now and continuing the use of Ozempic.  Finally given his constipation he will benefit from starting MiraLAX and uptitrate as needed. ? ?- Check MELD labs, CBC and Fibrotest ?- Referral to [facility] for bariatric surgery evaluation ?- Continue follow up with PCP regarding Ozempic and diabetes control ?- Explained to the patient the etiology and consequences of his current liver disease. Patient was counseled about the benefit of implementing a Mediterranean diet and exercise plan to decrease at least 5% of weight. The patient understood about the importance of lifestyle changes to potentially reverse his liver involvement. ?- Start taking Miralax 1 capful every day for  one week. If bowel movements do not improve, increase to 1 capful every 12 hours. If after two weeks there is no improvement, increase to 1 capful every 8 hours ? ? ?All questions were answered.     ? ?Harvel Quale, MD ?Gastroenterology and Hepatology ?[facility] ? ?

## 2021-09-23 LAB — COMPREHENSIVE METABOLIC PANEL
AG Ratio: 1.6 (calc) (ref 1.0–2.5)
ALT: 46 U/L (ref 9–46)
AST: 31 U/L (ref 10–40)
Albumin: 4.4 g/dL (ref 3.6–5.1)
Alkaline phosphatase (APISO): 90 U/L (ref 36–130)
BUN: 9 mg/dL (ref 7–25)
CO2: 24 mmol/L (ref 20–32)
Calcium: 9.9 mg/dL (ref 8.6–10.3)
Chloride: 102 mmol/L (ref 98–110)
Creat: 0.84 mg/dL (ref 0.60–1.29)
Globulin: 2.8 g/dL (calc) (ref 1.9–3.7)
Glucose, Bld: 216 mg/dL — ABNORMAL HIGH (ref 65–139)
Potassium: 4.1 mmol/L (ref 3.5–5.3)
Sodium: 139 mmol/L (ref 135–146)
Total Bilirubin: 0.7 mg/dL (ref 0.2–1.2)
Total Protein: 7.2 g/dL (ref 6.1–8.1)

## 2021-09-23 LAB — CBC WITH DIFFERENTIAL/PLATELET
Absolute Monocytes: 708 cells/uL (ref 200–950)
Basophils Absolute: 39 cells/uL (ref 0–200)
Basophils Relative: 0.4 %
Eosinophils Absolute: 301 cells/uL (ref 15–500)
Eosinophils Relative: 3.1 %
HCT: 42.3 % (ref 38.5–50.0)
Hemoglobin: 14.1 g/dL (ref 13.2–17.1)
Lymphs Abs: 2532 cells/uL (ref 850–3900)
MCH: 28.2 pg (ref 27.0–33.0)
MCHC: 33.3 g/dL (ref 32.0–36.0)
MCV: 84.6 fL (ref 80.0–100.0)
MPV: 12.2 fL (ref 7.5–12.5)
Monocytes Relative: 7.3 %
Neutro Abs: 6121 cells/uL (ref 1500–7800)
Neutrophils Relative %: 63.1 %
Platelets: 194 10*3/uL (ref 140–400)
RBC: 5 10*6/uL (ref 4.20–5.80)
RDW: 12.8 % (ref 11.0–15.0)
Total Lymphocyte: 26.1 %
WBC: 9.7 10*3/uL (ref 3.8–10.8)

## 2021-09-23 LAB — LIVER FIBROSIS, FIBROTEST-ACTITEST
ALT: 44 U/L (ref 9–46)
Alpha-2-Macroglobulin: 288 mg/dL — ABNORMAL HIGH (ref 106–279)
Apolipoprotein A1: 132 mg/dL (ref 94–176)
Bilirubin: 0.6 mg/dL (ref 0.2–1.2)
Fibrosis Score: 0.41
GGT: 68 U/L (ref 3–95)
Haptoglobin: 187 mg/dL (ref 43–212)
Necroinflammat ACT Score: 0.28
Reference ID: 4275175

## 2021-09-23 LAB — PROTIME-INR
INR: 1
Prothrombin Time: 10.6 s (ref 9.0–11.5)

## 2021-10-20 ENCOUNTER — Telehealth (INDEPENDENT_AMBULATORY_CARE_PROVIDER_SITE_OTHER): Payer: Self-pay | Admitting: *Deleted

## 2021-10-20 NOTE — Telephone Encounter (Signed)
Spoke to schedule at [facility], they have rec'd referral for patient to start bariatric surgery porcess - at this time they are waiting for patient to complete online seminars. ?

## 2021-11-23 LAB — BASIC METABOLIC PANEL
BUN: 12 (ref 4–21)
Creatinine: 0.7 (ref 0.6–1.3)
Glucose: 124

## 2021-11-23 LAB — HEPATIC FUNCTION PANEL
ALT: 48 U/L — AB (ref 10–40)
AST: 33 (ref 14–40)

## 2021-11-23 LAB — HEMOGLOBIN A1C: Hemoglobin A1C: 5.9

## 2021-11-24 LAB — COMPREHENSIVE METABOLIC PANEL: eGFR: 120

## 2022-05-15 LAB — HEMOGLOBIN A1C: Hemoglobin A1C: 5.8

## 2022-08-01 ENCOUNTER — Ambulatory Visit: Payer: BLUE CROSS/BLUE SHIELD | Admitting: Nurse Practitioner

## 2022-08-01 ENCOUNTER — Encounter: Payer: Self-pay | Admitting: Nurse Practitioner

## 2022-08-01 VITALS — BP 130/75 | HR 80 | Ht 76.0 in | Wt 335.2 lb

## 2022-08-01 DIAGNOSIS — E049 Nontoxic goiter, unspecified: Secondary | ICD-10-CM | POA: Diagnosis not present

## 2022-08-01 NOTE — Patient Instructions (Signed)
Goiter  A goiter is an enlarged thyroid gland. The thyroid gland is located in the lower front part of the neck, just in front of the windpipe (trachea). This gland makes hormones that affect how the body processes food for energy (metabolism) and how the heart and brain function. Most goiters are painless and are not a cause for concern. Some goiters can affect the way your thyroid makes thyroid hormones. Goiters and conditions that cause goiters can be treated, if necessary. What are the causes? This condition may be caused by: Lack of a mineral called iodine. The thyroid gland uses iodine to make thyroid hormones. Diseases that attack healthy cells in the body (autoimmune diseases) and affect thyroid function, such as Graves' disease or Hashimoto's disease. These diseases may cause the body to produce too much thyroid hormone (hyperthyroidism) or too little of the hormone (hypothyroidism). Conditions that cause inflammation of the thyroid (thyroiditis). One or more small growths on the thyroid (nodular goiter). Other causes may include: Medical problems caused by abnormal genes that are passed from parent to child (genetic defects). Thyroid injury or infection. Tumors that may or may not be cancerous. Pregnancy. Certain medicines. Exposure to radiation. In some cases, the cause may not be known. What increases the risk? The following factors may make you more likely to develop this condition: You do not get enough iodine in your diet. You have a family history of goiter. You are male. You are older than age 17. You smoke tobacco. You have had exposure to radiation. What are the signs or symptoms? The main symptom of this condition is swelling in the lower, front part of the neck. This swelling can range from a very small bump to a large lump. Other symptoms may include: A tight feeling in the throat. A hoarse voice. Coughing. Wheezing. Difficulty swallowing or breathing. Bulging  veins in the neck. Dizziness. When a goiter is the result of an overactive thyroid (hyperthyroidism), symptoms may also include: Nervousness or restlessness. Inability to tolerate heat. Unexplained weight loss. Diarrhea. Changes in heartbeat, such as skipped beats, extra beats, or a rapid heart rate. Loss of menstruation. Increased appetite. Sleep problems. When a goiter is the result of an underactive thyroid (hypothyroidism), symptoms may also include: Feeling tired (fatigue). Inability to tolerate cold. Weight gain that is not explained by a change in diet or exercise habits. Dry skin or coarse hair. Irregular menstrual periods. Constipation. Sadness or depression. In some cases, there may not be any symptoms. How is this diagnosed? This condition may be diagnosed based on your symptoms, your medical history, and a physical exam. You may have tests, such as: Blood tests to check thyroid function. Imaging tests, such as: Ultrasound. CT scan. MRI. Thyroid scan. Removal of a tissue sample (biopsy) of the goiter or any nodules. The sample will be tested to check for cancer. How is this treated? Treatment for this condition depends on the cause and your symptoms. Treatment may include: Medicines to regulate thyroid hormone levels. Anti-inflammatory medicines or steroid medicines, if the goiter is caused by inflammation. Iodine supplements or changes to your diet, if the goiter is caused by iodine deficiency. Radioactive iodine treatment. Surgery to remove your thyroid. In some cases, you may only need regular check-ups with your health care provider to monitor your condition, and you may not need treatment. Follow these instructions at home: Follow instructions from your health care provider about any changes to your diet. Take over-the-counter and prescription medicines only as told  by your health care provider. These include supplements. Do not use any products that contain  nicotine or tobacco. These products include cigarettes, chewing tobacco, and vaping devices, such as e-cigarettes. If you need help quitting, ask your health care provider. Keep all follow-up visits. Your health care provider will want to repeat blood tests to check thyroid function. Where to find more information American Thyroid Association: thyroid.org Endocrine Society: endocrine.org Contact a health care provider if: Your symptoms do not get better with treatment. You have nausea, vomiting, or diarrhea. You have a fever. You suddenly become very weak. You experience extreme restlessness. Get help right away if: You have sudden, unexplained confusion or other mental changes. You have chest pain. You have trouble breathing or swallowing. You have fast or irregular heartbeats (palpitations). These symptoms may be an emergency. Get help right away. Call 911. Do not wait to see if the symptoms will go away. Do not drive yourself to the hospital. Summary A goiter is an enlarged thyroid gland. The thyroid gland is located in the lower front part of the neck, just in front of the windpipe. The main symptom of this condition is swelling in the lower, front part of the neck. This swelling can range from a very small bump to a large lump. Treatment for this condition depends on the cause and your symptoms. You may need medicines, supplements, or regular monitoring of your condition. This information is not intended to replace advice given to you by your health care provider. Make sure you discuss any questions you have with your health care provider. Document Revised: 08/19/2021 Document Reviewed: 08/19/2021 Elsevier Patient Education  2023 Elsevier Inc.  

## 2022-08-01 NOTE — Progress Notes (Addendum)
Endocrinology Consult Note 08/01/22    ---------------------------------------------------------------------------------------------------------------------- Subjective    Past Medical History:  Diagnosis Date   Allergic rhinitis    Anxiety    Arrhythmia    Asthma    Diabetes mellitus without complication ([place])    Kidney stones    Obesity     Past Surgical History:  Procedure Laterality Date   CARDIAC ELECTROPHYSIOLOGY STUDY AND ABLATION     VASCULAR SURGERY      Social History   Socioeconomic History   Marital status: Married    Spouse name: Not on file   Number of children: Not on file   Years of education: Not on file   Highest education level: Not on file  Occupational History   Not on file  Tobacco Use   Smoking status: Never   Smokeless tobacco: Current    Types: Snuff  Vaping Use   Vaping Use: Never used  Substance and Sexual Activity   Alcohol use: No   Drug use: No   Sexual activity: Not on file  Other Topics Concern   Not on file  Social History Narrative   Not on file   Social Determinants of Health   Financial Resource Strain: Not on file  Food Insecurity: Not on file  Transportation Needs: Not on file  Physical Activity: Not on file  Stress: Not on file  Social Connections: Not on file  Intimate Partner Violence: Not on file    Current Outpatient Medications on File Prior to Visit  Medication Sig Dispense Refill   albuterol (PROVENTIL) (2.5 MG/3ML) 0.083% nebulizer solution Take 3 mLs (2.5 mg total) by nebulization every 6 (six) hours as needed for wheezing or shortness of breath. 75 mL 12   blood glucose meter kit and supplies Dispense based on patient and insurance preference. Use to check glucose once daily (FOR ICD-10  E11.9). 1 each 5   clonazePAM (KLONOPIN) 0.5 MG tablet TAKE 1 TABLET BY MOUTH UP TO TWICE DAILY AS NEEDED. MUST LAST 30 DAYS. 30 tablet 0   folic acid (FOLVITE) 1 MG tablet Take 1 mg by mouth daily.     glipiZIDE  (GLUCOTROL XL) 5 MG 24 hr tablet Take 5 mg by mouth every morning.     glucose blood test strip Test glucose 1x per day. 100 each 3   lamoTRIgine (LAMICTAL) 25 MG tablet Take 2 tablets (50 mg total) by mouth daily. Take 1 tab p.o. daily for 14 days, then increase to 2 tab daily. 60 tablet 2   metFORMIN (GLUCOPHAGE) 500 MG tablet TAKE 2 TABLETS BY MOUTH TWICE DAILY. 120 tablet 0   Multiple Vitamin (MULTIVITAMIN WITH MINERALS) TABS tablet Take 1 tablet by mouth daily.     Semaglutide,0.25 or 0.5MG /DOS, (OZEMPIC, 0.25 OR 0.5 MG/DOSE,) 2 MG/1.5ML SOPN Inject into the skin once a week.     venlafaxine XR (EFFEXOR-XR) 75 MG 24 hr capsule Take 75 mg by mouth at bedtime.     VRAYLAR 3 MG capsule Take 3 mg by mouth daily.     No current facility-administered medications on file prior to visit.      HPI   Aaron Chambers is a 42 y.o.-year-old male, referred by his PCP, Dr.Golding, for evaluation for multinodular goiter.  His PCP was worried that his use of Ozempic could have contributed to his goiter.  He has been on Ozempic for over 1 year and has had great success managing his diabetes with it.  Thyroid U/S: 06/16/22  No  prior examinations available for comparison.  Right lobe: Right lobe of the thyroid gland measures 6.7 x 1.8 x 2.6 cm in overall dimension. Right lobe nodules: No discrete nodules seen.  Left lobe: Left lobe of the gland measures 7.0 x 2.3 x 2.0 cm in dimension. Left lobe nodules: solitary nodule left lobe Nodule 1: small hypoechoic cystic nodule midportion left lobe measuring 0.3 x 0.2 x 0.3 cm in dimension TR 4  Isthmus: Isthmus measures 0.63 cm.  No discrete nodule seen  Texture:  Texture is homogeneous.  No calcification or shadowing. Blood flow: Normal vascularity seen.     I reviewed pt's thyroid tests: Lab Results  Component Value Date   TSH 1.490 02/25/2019   TSH 1.240 11/03/2015   TSH 1.560 03/23/2014     Pt c/o: - nerve pain in fingers and toes (like  electrical shock) - anxiety (managed well on current medications)  Pt denies - feeling nodules in neck - hoarseness - dysphagia - choking - SOB with lying down  No FH of thyroid ds. No FH of thyroid cancer. No h/o radiation tx to head or neck.  No seaweed or kelp. No recent contrast studies. No steroid use. No herbal supplements. He does take a daily MVI which has Biotin in it.  Pt also has a history of anxiety, tobacco abuse (dips), diabetes, HLD, fatty liver disease.  Review of systems  Constitutional: + steadily decreasing body weight (currently on Ozempic for DM),  current Body mass index is 40.8 kg/m. , no fatigue, no subjective hyperthermia, no subjective hypothermia Eyes: no blurry vision, no xerophthalmia ENT: no sore throat, no nodules palpated in throat, no dysphagia/odynophagia, no hoarseness Cardiovascular: no chest pain, no shortness of breath, no palpitations, no leg swelling Respiratory: no cough, no shortness of breath Gastrointestinal: no nausea/vomiting/diarrhea Musculoskeletal: no muscle/joint aches Skin: no rashes, no hyperemia Neurological: no tremors, + numbness/tingling/nerve pain to bilateral hands and feet, no dizziness Psychiatric: no depression, + anxiety- currently managed well with meds  ---------------------------------------------------------------------------------------------------------------------- Objective    BP 130/75 (BP Location: Left Arm, Patient Position: Sitting, Cuff Size: Large)   Pulse 80   Ht 6\' 4"  (1.93 m)   Wt (!) 335 lb 3.2 oz (152 kg)   BMI 40.80 kg/m    BP Readings from Last 3 Encounters:  08/01/22 130/75  09/15/21 (!) 150/87  03/15/21 135/86    Wt Readings from Last 3 Encounters:  08/01/22 (!) 335 lb 3.2 oz (152 kg)  09/15/21 (!) 372 lb (168.7 kg)  03/15/21 (!) 379 lb 3.2 oz (172 kg)     Physical Exam- Limited  Constitutional:  Body mass index is 40.8 kg/m. , not in acute distress, normal state of mind Eyes:   EOMI, no exophthalmos Neck: fullness noted bilaterally Thyroid: + gross goiter, firm texture, nontender to palpation, no palpable nodularity Cardiovascular: RRR, no murmurs, rubs, or gallops, no edema Respiratory: Adequate breathing efforts, no crackles, rales, rhonchi, or wheezing Musculoskeletal: no gross deformities, strength intact in all four extremities, no gross restriction of joint movements Skin:  no rashes, no hyperemia Neurological: no tremor with outstretched hands      ----------------------------------------------------------------------------------------------------------------------  ASSESSMENT / PLAN:  1. Thyroid Nodule 2. Goiter  - I reviewed the images of his thyroid ultrasound along with the patient.  Otherwise, the nodule is: - without microcalcifications - without internal blood flow - more wide than tall - well delimited from surrounding tissue  His recent thyroid ultrasound shows small TR4 nodule in the left  mid thyroid gland.  Will recommend repeat US in 1 year for surveillance.  Will order comprehensive thyroid labs to assess for any underlying dysfunction: TSH, FT4, FT3, and antibody testing.  He is advised to avoid products containing Biotin (including his MVI) for 5 days prior to having thyroid blood work done to prevent interference with labs.  I do not feel that his thyromegaly is directly related to his use of Ozempic.  He has been on Ozempic for over a year.  There is some literature showing the association of thyroid cancer with use of GLP1 medications, but he has none of the typical high risk factors.    Follow Up Plan: Return in about 2 weeks (around 08/15/2022) for Thyroid follow up, Previsit labs.    I spent 45 minutes in the care of the patient today including review of labs from Thyroid Function, CMP, and other relevant labs ; imaging/biopsy records (current and previous including abstractions from other facilities); face-to-face time  discussing  his lab results and symptoms, medications doses, his options of short and long term treatment based on the latest standards of care / guidelines;   and documenting the encounter.  Isabel Caprice Pellot  participated in the discussions, expressed understanding, and voiced agreement with the above plans.  All questions were answered to his satisfaction. he is encouraged to contact clinic should he have any questions or concerns prior to his return visit.    Rayetta Pigg, [facility] [address] [place], [place] [address] Phone: [phone] Fax: [phone]

## 2022-08-13 LAB — T4, FREE: Free T4: 1.21 ng/dL (ref 0.82–1.77)

## 2022-08-13 LAB — THYROGLOBULIN ANTIBODY: Thyroglobulin Antibody: <1 IU/mL (ref 0.0–0.9)

## 2022-08-13 LAB — T3, FREE: T3, Free: 3.2 pg/mL (ref 2.0–4.4)

## 2022-08-13 LAB — TSH: TSH: 1.41 u[IU]/mL (ref 0.450–4.500)

## 2022-08-13 LAB — THYROID PEROXIDASE ANTIBODY: Thyroperoxidase Ab SerPl-aCnc: <9 IU/mL (ref 0–34)

## 2022-08-16 ENCOUNTER — Ambulatory Visit: Payer: BLUE CROSS/BLUE SHIELD | Admitting: Nurse Practitioner

## 2022-08-16 DIAGNOSIS — E049 Nontoxic goiter, unspecified: Secondary | ICD-10-CM

## 2022-09-18 ENCOUNTER — Encounter (INDEPENDENT_AMBULATORY_CARE_PROVIDER_SITE_OTHER): Payer: Self-pay

## 2022-09-18 ENCOUNTER — Ambulatory Visit (INDEPENDENT_AMBULATORY_CARE_PROVIDER_SITE_OTHER): Payer: 59 | Admitting: Gastroenterology

## 2022-11-02 ENCOUNTER — Encounter (INDEPENDENT_AMBULATORY_CARE_PROVIDER_SITE_OTHER): Payer: Self-pay | Admitting: Gastroenterology

## 2022-11-02 ENCOUNTER — Ambulatory Visit (INDEPENDENT_AMBULATORY_CARE_PROVIDER_SITE_OTHER): Payer: BC Managed Care – PPO | Admitting: Gastroenterology

## 2022-11-02 VITALS — BP 137/83 | HR 69 | Temp 97.8°F | Ht 76.0 in | Wt 345.9 lb

## 2022-11-02 DIAGNOSIS — K5900 Constipation, unspecified: Secondary | ICD-10-CM | POA: Insufficient documentation

## 2022-11-02 DIAGNOSIS — K7581 Nonalcoholic steatohepatitis (NASH): Secondary | ICD-10-CM

## 2022-11-02 DIAGNOSIS — K5904 Chronic idiopathic constipation: Secondary | ICD-10-CM

## 2022-11-02 DIAGNOSIS — K7400 Hepatic fibrosis, unspecified: Secondary | ICD-10-CM | POA: Diagnosis not present

## 2022-11-02 NOTE — Patient Instructions (Addendum)
Continue working on weight loss, you're doing a great job! Continue a Mediterranean diet Continue daily fiber supplement intake Will discuss if candidate for Rezdiffra in next appointment

## 2022-11-02 NOTE — Progress Notes (Signed)
Aaron Chambers, M.D. Gastroenterology & Hepatology [facility] Gastroenterology [address] [place], [place] [address]  Primary Care Physician: Donetta Potts, MD [address] [place] [address]  I will communicate my assessment and recommendations to the referring MD via EMR.  Problems: NASH with F1/F2 fibrosis Umbilical hernia  History of Present Illness: Aaron Chambers is a 42 y.o. male with past medical history of anxiety, asthma, diabetes, obesity and NASH, who presents for follow up of NASH.   The patient was last seen on 09/15/2021. At that time, the patient had blood workup ordered.  Was advised to continue [employer] and work on weight loss, as well as Psychologist, counselling with PCP.  Was also advised to start MiraLAX on a daily basis.  States his umbilical hernia is getting larger but it does not hurt. He is taking fiber supplement daily, which is keeping him regular with daily bowel movements. The patient denies having any nausea, vomiting, fever, chills, hematochezia, melena, hematemesis, abdominal distention, abdominal pain, diarrhea, jaundice, pruritus . Lost almost 30 lb on purpose since the last time seen in clinic.  Patient was referred to [facility] surgery for evaluation of bariatric surgery but they were still waiting for the patient to finish online seminars before seeing him.  Blood workup from 09/15/2021 showed F1 to F2 fibrosis based on Fibrotest.  CMP showed mild elevation of ALT of 46 with AST of 31, alkaline phosphatase 90, total bilirubin 0.7, normal electrolytes and renal function, INR was normal at 1.0 and CBC was normal with a platelet count of 194.  Liver elastography was performed 03/28/2021 which showed kPa of 15.1 but this was limited due to the body habitus of the patient.   Last EGD: never Last Colonoscopy: never  Past Medical History: Past Medical History:  Diagnosis Date   Allergic rhinitis    Anxiety     Arrhythmia    Asthma    Diabetes mellitus without complication    Kidney stones    Obesity     Past Surgical History: Past Surgical History:  Procedure Laterality Date   CARDIAC ELECTROPHYSIOLOGY STUDY AND ABLATION     VASCULAR SURGERY      Family History:History reviewed. No pertinent family history.  Social History: Social History   Tobacco Use  Smoking Status Never  Smokeless Tobacco Current   Types: Snuff   Social History   Substance and Sexual Activity  Alcohol Use No   Social History   Substance and Sexual Activity  Drug Use No    Allergies: No Known Allergies  Medications: Current Outpatient Medications  Medication Sig Dispense Refill   albuterol (PROVENTIL) (2.5 MG/3ML) 0.083% nebulizer solution Take 3 mLs (2.5 mg total) by nebulization every 6 (six) hours as needed for wheezing or shortness of breath. 75 mL 12   blood glucose meter kit and supplies Dispense based on patient and insurance preference. Use to check glucose once daily (FOR ICD-10  E11.9). 1 each 5   clonazePAM (KLONOPIN) 0.5 MG tablet TAKE 1 TABLET BY MOUTH UP TO TWICE DAILY AS NEEDED. MUST LAST 30 DAYS. 30 tablet 0   folic acid (FOLVITE) 1 MG tablet Take 1 mg by mouth daily.     glipiZIDE (GLUCOTROL XL) 5 MG 24 hr tablet Take 5 mg by mouth every morning.     glucose blood test strip Test glucose 1x per day. 100 each 3   lamoTRIgine (LAMICTAL) 25 MG tablet Take 2 tablets (50  mg total) by mouth daily. Take 1 tab p.o. daily for 14 days, then increase to 2 tab daily. 60 tablet 2   metFORMIN (GLUCOPHAGE) 500 MG tablet TAKE 2 TABLETS BY MOUTH TWICE DAILY. 120 tablet 0   Multiple Vitamin (MULTIVITAMIN WITH MINERALS) TABS tablet Take 1 tablet by mouth daily.     Semaglutide,0.25 or 0.5MG /DOS, (OZEMPIC, 0.25 OR 0.5 MG/DOSE,) 2 MG/1.5ML SOPN Inject into the skin once a week.     venlafaxine XR (EFFEXOR-XR) 75 MG 24 hr capsule Take 75 mg by mouth at bedtime.     VRAYLAR 3 MG capsule Take 3 mg by mouth  daily.     No current facility-administered medications for this visit.    Review of Systems: GENERAL: negative for malaise, night sweats HEENT: No changes in hearing or vision, no nose bleeds or other nasal problems. NECK: Negative for lumps, goiter, pain and significant neck swelling RESPIRATORY: Negative for cough, wheezing CARDIOVASCULAR: Negative for chest pain, leg swelling, palpitations, orthopnea GI: SEE HPI MUSCULOSKELETAL: Negative for joint pain or swelling, back pain, and muscle pain. SKIN: Negative for lesions, rash PSYCH: Negative for sleep disturbance, mood disorder and recent psychosocial stressors. HEMATOLOGY Negative for prolonged bleeding, bruising easily, and swollen nodes. ENDOCRINE: Negative for cold or heat intolerance, polyuria, polydipsia and goiter. NEURO: negative for tremor, gait imbalance, syncope and seizures. The remainder of the review of systems is noncontributory.   Physical Exam: BP 137/83 (BP Location: Left Arm, Patient Position: Sitting, Cuff Size: Large)   Pulse 69   Temp 97.8 F (36.6 C) (Temporal)   Ht  (1.93 m)   Wt (!) 345 lb 14.4 oz (156.9 kg)   BMI 42.10 kg/m  GENERAL: The patient is AO x3, in no acute distress. HEENT: Head is normocephalic and atraumatic. EOMI are intact. Mouth is well hydrated and without lesions. NECK: Supple. No masses LUNGS: Clear to auscultation. No presence of rhonchi/wheezing/rales. Adequate chest expansion HEART: RRR, normal s1 and s2. ABDOMEN: Soft, nontender, no guarding, no peritoneal signs, and nondistended. BS +. No masses. EXTREMITIES: Without any cyanosis, clubbing, rash, lesions or edema. NEUROLOGIC: AOx3, no focal motor deficit. SKIN: no jaundice, no rashes  Imaging/Labs: as above  I personally reviewed and interpreted the available labs, imaging and endoscopic files.  Impression and Plan: Aaron Chambers is a 42 y.o. male with past medical history of anxiety, asthma, diabetes, obesity  and NASH, who presents for follow up of NASH.  Patient has felt well and denies any complaints at the moment.  He has worked intensively in losing weight and has been successful with this, has lost close to 30 pounds.  I congratulated him for this.  He should continue to implement the Mediterranean diet to improve his liver fibrosis.  Ultimately, if he presents worsening of his fibrosis, we could consider candidacy for Rezdiffra in follow-up appointment.  Bowel movements have improved with the intake of fiber supplements, which he should keep taking.  - Continue working on weight loss, you're doing a great job! - Continue a Mediterranean diet - Continue daily fiber supplement intake - Will discuss if candidate for Rezdiffra in next appointment given   All questions were answered.      Aaron Blazing, MD Gastroenterology and Hepatology [facility] Gastroenterology

## 2023-07-23 ENCOUNTER — Other Ambulatory Visit (HOSPITAL_COMMUNITY): Payer: Self-pay | Admitting: Orthopedic Surgery

## 2023-07-23 DIAGNOSIS — M545 Low back pain, unspecified: Secondary | ICD-10-CM

## 2023-07-26 ENCOUNTER — Ambulatory Visit (HOSPITAL_COMMUNITY)
Admission: RE | Admit: 2023-07-26 | Discharge: 2023-07-26 | Disposition: A | Discharge status: Home / Self Care | Payer: 59 | Source: Ambulatory Visit | Attending: Orthopedic Surgery | Admitting: Orthopedic Surgery

## 2023-07-26 DIAGNOSIS — M545 Low back pain, unspecified: Secondary | ICD-10-CM | POA: Insufficient documentation

## 2023-11-05 ENCOUNTER — Ambulatory Visit (INDEPENDENT_AMBULATORY_CARE_PROVIDER_SITE_OTHER): Payer: Self-pay | Admitting: Gastroenterology

## 2023-11-05 ENCOUNTER — Encounter (INDEPENDENT_AMBULATORY_CARE_PROVIDER_SITE_OTHER): Payer: Self-pay | Admitting: Gastroenterology

## 2023-11-05 ENCOUNTER — Other Ambulatory Visit: Payer: Self-pay | Admitting: *Deleted

## 2023-11-05 VITALS — BP 144/85 | HR 69 | Temp 97.5°F | Ht 76.0 in | Wt 344.0 lb

## 2023-11-05 DIAGNOSIS — K7581 Nonalcoholic steatohepatitis (NASH): Secondary | ICD-10-CM | POA: Diagnosis not present

## 2023-11-05 DIAGNOSIS — K429 Umbilical hernia without obstruction or gangrene: Secondary | ICD-10-CM

## 2023-11-05 DIAGNOSIS — K7400 Hepatic fibrosis, unspecified: Secondary | ICD-10-CM

## 2023-11-05 NOTE — Progress Notes (Signed)
 Aaron Chambers, M.D. Gastroenterology & Hepatology [facility] Gastroenterology [address] [place], [place] [address]  Primary Care Physician: Lauran Pollard, MD [address] [place] [address]  I will communicate my assessment and recommendations to the referring MD via EMR.  Problems: NASH with F1/F2 fibrosis Umbilical hernia   History of Present Illness: Aaron Chambers is a 43 y.o. male with past medical history of anxiety, asthma, diabetes, obesity and NASH, who presents for follow up of NASH.  The patient was last seen on 11/02/2018 for. At that time, the patient was encouraged to continue working on weight loss and to continue Mediterranean diet changes.  The patient denies having any nausea, vomiting, fever, chills, hematochezia, melena, hematemesis, abdominal distention, abdominal pain, diarrhea, jaundice, pruritus.  Patient will be seeing Dr. Larrie Po for umbilical hernia repair as he is having issues with hernia popping out and causing recurrent fungal infections in the periumbilical area.  His weight is the same as in his previous appointment.  He is currently on Ozempic for diabetes, but he is in the process to switch him to a different agent Florence Hunt).  Fibrotest on 09/15/2021 showed F1 to F2 fibrosis.  Patient had blood workup performed by PCP - no labs are available today but he was told the liver enzymes are still elevated (aminotransferases possibly in the 50s).  Does not drink alcohol often.  Liver elastography was performed 03/28/2021 which showed kPa of 15.1 but this was limited due to the body habitus of the patient.    Last EGD: never Last Colonoscopy: never  Past Medical History: Past Medical History:  Diagnosis Date   Allergic rhinitis    Anxiety    Arrhythmia    Asthma    Diabetes mellitus without complication (HCC)    Kidney stones    Obesity     Past Surgical History: Past Surgical History:  Procedure  Laterality Date   CARDIAC ELECTROPHYSIOLOGY STUDY AND ABLATION     VASCULAR SURGERY      Family History:No family history on file.  Social History: Social History   Tobacco Use  Smoking Status Never  Smokeless Tobacco Current   Types: Snuff   Social History   Substance and Sexual Activity  Alcohol Use No   Social History   Substance and Sexual Activity  Drug Use No    Allergies: No Known Allergies  Medications: Current Outpatient Medications  Medication Sig Dispense Refill   albuterol  (PROVENTIL ) (2.5 MG/3ML) 0.083% nebulizer solution Take 3 mLs (2.5 mg total) by nebulization every 6 (six) hours as needed for wheezing or shortness of breath. 75 mL 12   blood glucose meter kit and supplies Dispense based on patient and insurance preference. Use to check glucose once daily (FOR ICD-10  E11.9). 1 each 5   clonazePAM  (KLONOPIN ) 0.5 MG tablet TAKE 1 TABLET BY MOUTH UP TO TWICE DAILY AS NEEDED. MUST LAST 30 DAYS. 30 tablet 0   folic acid (FOLVITE) 1 MG tablet Take 1 mg by mouth daily.     glipiZIDE  (GLUCOTROL  XL) 5 MG 24 hr tablet Take 5 mg by mouth every morning.     glucose blood test strip Test glucose 1x per day. 100 each 3   lamoTRIgine  (LAMICTAL ) 25 MG tablet Take 2 tablets (50 mg total) by mouth daily. Take 1 tab p.o. daily for 14 days, then increase to 2 tab daily. 60 tablet 2   metFORMIN  (GLUCOPHAGE ) 500 MG tablet TAKE 2 TABLETS BY  MOUTH TWICE DAILY. 120 tablet 0   Multiple Vitamin (MULTIVITAMIN WITH MINERALS) TABS tablet Take 1 tablet by mouth daily.     Semaglutide,0.25 or 0.5MG /DOS, (OZEMPIC, 0.25 OR 0.5 MG/DOSE,) 2 MG/1.5ML SOPN Inject into the skin once a week.     venlafaxine XR (EFFEXOR-XR) 75 MG 24 hr capsule Take 75 mg by mouth at bedtime.     VRAYLAR 3 MG capsule Take 3 mg by mouth daily.     No current facility-administered medications for this visit.    Review of Systems: GENERAL: negative for malaise, night sweats HEENT: No changes in hearing or  vision, no nose bleeds or other nasal problems. NECK: Negative for lumps, goiter, pain and significant neck swelling RESPIRATORY: Negative for cough, wheezing CARDIOVASCULAR: Negative for chest pain, leg swelling, palpitations, orthopnea GI: SEE HPI MUSCULOSKELETAL: Negative for joint pain or swelling, back pain, and muscle pain. SKIN: Negative for lesions, rash PSYCH: Negative for sleep disturbance, mood disorder and recent psychosocial stressors. HEMATOLOGY Negative for prolonged bleeding, bruising easily, and swollen nodes. ENDOCRINE: Negative for cold or heat intolerance, polyuria, polydipsia and goiter. NEURO: negative for tremor, gait imbalance, syncope and seizures. The remainder of the review of systems is noncontributory.   Physical Exam: BP (!) 149/88 (BP Location: Left Arm, Patient Position: Sitting, Cuff Size: Large)   Pulse 79   Temp (!) 97.5 F (36.4 C) (Temporal)   Ht 6\' 4"  (1.93 m)   Wt (!) 344 lb (156 kg)   BMI 41.87 kg/m  GENERAL: The patient is AO x3, in no acute distress. HEENT: Head is normocephalic and atraumatic. EOMI are intact. Mouth is well hydrated and without lesions. NECK: Supple. No masses LUNGS: Clear to auscultation. No presence of rhonchi/wheezing/rales. Adequate chest expansion HEART: RRR, normal s1 and s2. ABDOMEN: Soft, nontender, no guarding, no peritoneal signs, and nondistended. BS +. Has a medium size reducible umbilical hernia. EXTREMITIES: Without any cyanosis, clubbing, rash, lesions or edema. NEUROLOGIC: AOx3, no focal motor deficit. SKIN: no jaundice, no rashes  Imaging/Labs: as above  I personally reviewed and interpreted the available labs, imaging and endoscopic files.  Impression and Plan: Aaron Chambers is a 43 y.o. male with past medical history of anxiety, asthma, diabetes, obesity and NASH, who presents for follow up of NASH.  Patient had medical weight loss in the past after implementing dietary changes but his weight  has plateaued since then.  He reports that his last blood workup showed elevated aminotransferases but unfortunately I do not have these available.  Will request these results from PCP at this moment.  Given previous abnormal FibroTest, I consider it will be important to perform an ELF test to determine if there is any degree of advanced fibrosis that would warrant concomitant management with Resmetiron -this test will be ordered today.  I advised the patient to continue working on his dietary modifications and concomitant use of GLP-1 agonists.  He is presenting some discomfort at the site of his umbilical hernia.  Symptoms are primarily skin related but aggravated by herniation.  He will proceed with surgical evaluation with Dr. Larrie Po tomorrow.  -Request labs from PCP - Check ELF test. -Continue working on weight loss with exercising and dietary modifications, also continue current regimen with Ozempic and possible switch to Mounjaro -Proceed with surgical evaluation for umbilical hernia  All questions were answered.      Aaron Cress, MD Gastroenterology and Hepatology [facility] Gastroenterology

## 2023-11-05 NOTE — Patient Instructions (Signed)
 Perform blood workup Continue working on weight loss with exercising and dietary modifications, also continue current regimen with Ozempic and possible switch to [facility] Proceed with surgical evaluation for umbilical hernia

## 2023-11-06 ENCOUNTER — Ambulatory Visit: Admitting: General Surgery

## 2023-11-06 ENCOUNTER — Encounter: Payer: Self-pay | Admitting: General Surgery

## 2023-11-06 VITALS — BP 137/84 | HR 86 | Temp 98.8°F | Resp 14 | Ht 76.0 in | Wt 350.0 lb

## 2023-11-06 DIAGNOSIS — K429 Umbilical hernia without obstruction or gangrene: Secondary | ICD-10-CM

## 2023-11-07 LAB — ENHANCED LIVER FIBROSIS (ELF): ELF(TM) Score: 9.6 (ref <9.80)

## 2023-11-07 NOTE — Progress Notes (Signed)
 Aaron Chambers; [id number]; 1981-04-17   HPI Patient is a 43 year old white male who was referred to my care by Armen Land, MD for evaluation treatment of an umbilical hernia.  He states he has had an umbilical hernia for some time now, but it is increasing in size and is causing him discomfort.  He has never had an episode of incarceration.  He denies any nausea or vomiting.  It is made worse with straining. Past Medical History:  Diagnosis Date   Allergic rhinitis    Anxiety    Arrhythmia    Asthma    Diabetes mellitus without complication (HCC)    Kidney stones    Obesity     Past Surgical History:  Procedure Laterality Date   CARDIAC ELECTROPHYSIOLOGY STUDY AND ABLATION     VASCULAR SURGERY      History reviewed. No pertinent family history.  Current Outpatient Medications on File Prior to Visit  Medication Sig Dispense Refill   albuterol  (PROVENTIL ) (2.5 MG/3ML) 0.083% nebulizer solution Take 3 mLs (2.5 mg total) by nebulization every 6 (six) hours as needed for wheezing or shortness of breath. 75 mL 12   blood glucose meter kit and supplies Dispense based on patient and insurance preference. Use to check glucose once daily (FOR ICD-10  E11.9). 1 each 5   clonazePAM  (KLONOPIN ) 0.5 MG tablet TAKE 1 TABLET BY MOUTH UP TO TWICE DAILY AS NEEDED. MUST LAST 30 DAYS. 30 tablet 0   glipiZIDE  (GLUCOTROL  XL) 5 MG 24 hr tablet Take 5 mg by mouth every morning.     glucose blood test strip Test glucose 1x per day. 100 each 3   lamoTRIgine  (LAMICTAL ) 25 MG tablet Take 2 tablets (50 mg total) by mouth daily. Take 1 tab p.o. daily for 14 days, then increase to 2 tab daily. 60 tablet 2   metFORMIN  (GLUCOPHAGE ) 500 MG tablet TAKE 2 TABLETS BY MOUTH TWICE DAILY. 120 tablet 0   Multiple Vitamin (MULTIVITAMIN WITH MINERALS) TABS tablet Take 1 tablet by mouth daily.     Semaglutide,0.25 or 0.5MG /DOS, (OZEMPIC, 0.25 OR 0.5 MG/DOSE,) 2 MG/1.5ML SOPN Inject into the skin once a week.      venlafaxine XR (EFFEXOR-XR) 75 MG 24 hr capsule Take 75 mg by mouth at bedtime.     VRAYLAR 3 MG capsule Take 3 mg by mouth daily.     No current facility-administered medications on file prior to visit.    No Known Allergies  Social History   Substance and Sexual Activity  Alcohol Use No    Social History   Tobacco Use  Smoking Status Never  Smokeless Tobacco Current   Types: Snuff    Review of Systems  Constitutional: Negative.   HENT: Negative.    Eyes: Negative.   Respiratory: Negative.    Cardiovascular: Negative.   Gastrointestinal:  Positive for heartburn.  Genitourinary: Negative.   Musculoskeletal: Negative.   Skin: Negative.   Neurological: Negative.   Endo/Heme/Allergies: Negative.   Psychiatric/Behavioral: Negative.      Objective   Vitals:   11/06/23 1429  BP: 137/84  Pulse: 86  Resp: 14  Temp: 98.8 F (37.1 C)  SpO2: 93%    Physical Exam Vitals reviewed.  Constitutional:      Appearance: Normal appearance. He is obese. He is not ill-appearing.  HENT:     Head: Normocephalic and atraumatic.  Cardiovascular:     Rate and Rhythm: Normal rate and regular rhythm.     Heart sounds:  Normal heart sounds. No murmur heard.    No friction rub. No gallop.  Pulmonary:     Effort: Pulmonary effort is normal. No respiratory distress.     Breath sounds: Normal breath sounds. No stridor. No wheezing, rhonchi or rales.  Abdominal:     General: Bowel sounds are normal. There is no distension.     Palpations: Abdomen is soft. There is no mass.     Tenderness: There is no abdominal tenderness. There is no guarding or rebound.     Hernia: A hernia is present.     Comments: Greater than 4 cm reducible umbilical hernia.  Skin:    General: Skin is warm and dry.  Neurological:     Mental Status: He is alert and oriented to person, place, and time.   Primary care notes reviewed  Assessment  Umbilical hernia Plan  Patient is scheduled for robotic  assisted laparoscopic umbilical herniorrhaphy with mesh on 11/19/2023.  The risks and benefits of the procedure including bleeding, infection, mesh use, and the possibility of an open procedure were fully explained to the patient, who gave informed consent.

## 2023-11-07 NOTE — H&P (Signed)
 Aaron Chambers; [id number]; 1981-04-17   HPI Patient is a 43 year old white male who was referred to my care by Armen Land, MD for evaluation treatment of an umbilical hernia.  He states he has had an umbilical hernia for some time now, but it is increasing in size and is causing him discomfort.  He has never had an episode of incarceration.  He denies any nausea or vomiting.  It is made worse with straining. Past Medical History:  Diagnosis Date   Allergic rhinitis    Anxiety    Arrhythmia    Asthma    Diabetes mellitus without complication (HCC)    Kidney stones    Obesity     Past Surgical History:  Procedure Laterality Date   CARDIAC ELECTROPHYSIOLOGY STUDY AND ABLATION     VASCULAR SURGERY      History reviewed. No pertinent family history.  Current Outpatient Medications on File Prior to Visit  Medication Sig Dispense Refill   albuterol  (PROVENTIL ) (2.5 MG/3ML) 0.083% nebulizer solution Take 3 mLs (2.5 mg total) by nebulization every 6 (six) hours as needed for wheezing or shortness of breath. 75 mL 12   blood glucose meter kit and supplies Dispense based on patient and insurance preference. Use to check glucose once daily (FOR ICD-10  E11.9). 1 each 5   clonazePAM  (KLONOPIN ) 0.5 MG tablet TAKE 1 TABLET BY MOUTH UP TO TWICE DAILY AS NEEDED. MUST LAST 30 DAYS. 30 tablet 0   glipiZIDE  (GLUCOTROL  XL) 5 MG 24 hr tablet Take 5 mg by mouth every morning.     glucose blood test strip Test glucose 1x per day. 100 each 3   lamoTRIgine  (LAMICTAL ) 25 MG tablet Take 2 tablets (50 mg total) by mouth daily. Take 1 tab p.o. daily for 14 days, then increase to 2 tab daily. 60 tablet 2   metFORMIN  (GLUCOPHAGE ) 500 MG tablet TAKE 2 TABLETS BY MOUTH TWICE DAILY. 120 tablet 0   Multiple Vitamin (MULTIVITAMIN WITH MINERALS) TABS tablet Take 1 tablet by mouth daily.     Semaglutide,0.25 or 0.5MG /DOS, (OZEMPIC, 0.25 OR 0.5 MG/DOSE,) 2 MG/1.5ML SOPN Inject into the skin once a week.      venlafaxine XR (EFFEXOR-XR) 75 MG 24 hr capsule Take 75 mg by mouth at bedtime.     VRAYLAR 3 MG capsule Take 3 mg by mouth daily.     No current facility-administered medications on file prior to visit.    No Known Allergies  Social History   Substance and Sexual Activity  Alcohol Use No    Social History   Tobacco Use  Smoking Status Never  Smokeless Tobacco Current   Types: Snuff    Review of Systems  Constitutional: Negative.   HENT: Negative.    Eyes: Negative.   Respiratory: Negative.    Cardiovascular: Negative.   Gastrointestinal:  Positive for heartburn.  Genitourinary: Negative.   Musculoskeletal: Negative.   Skin: Negative.   Neurological: Negative.   Endo/Heme/Allergies: Negative.   Psychiatric/Behavioral: Negative.      Objective   Vitals:   11/06/23 1429  BP: 137/84  Pulse: 86  Resp: 14  Temp: 98.8 F (37.1 C)  SpO2: 93%    Physical Exam Vitals reviewed.  Constitutional:      Appearance: Normal appearance. He is obese. He is not ill-appearing.  HENT:     Head: Normocephalic and atraumatic.  Cardiovascular:     Rate and Rhythm: Normal rate and regular rhythm.     Heart sounds:  Normal heart sounds. No murmur heard.    No friction rub. No gallop.  Pulmonary:     Effort: Pulmonary effort is normal. No respiratory distress.     Breath sounds: Normal breath sounds. No stridor. No wheezing, rhonchi or rales.  Abdominal:     General: Bowel sounds are normal. There is no distension.     Palpations: Abdomen is soft. There is no mass.     Tenderness: There is no abdominal tenderness. There is no guarding or rebound.     Hernia: A hernia is present.     Comments: Greater than 4 cm reducible umbilical hernia.  Skin:    General: Skin is warm and dry.  Neurological:     Mental Status: He is alert and oriented to person, place, and time.   Primary care notes reviewed  Assessment  Umbilical hernia Plan  Patient is scheduled for robotic  assisted laparoscopic umbilical herniorrhaphy with mesh on 11/19/2023.  The risks and benefits of the procedure including bleeding, infection, mesh use, and the possibility of an open procedure were fully explained to the patient, who gave informed consent.

## 2023-11-08 ENCOUNTER — Other Ambulatory Visit (INDEPENDENT_AMBULATORY_CARE_PROVIDER_SITE_OTHER): Payer: Self-pay

## 2023-11-08 DIAGNOSIS — K7400 Hepatic fibrosis, unspecified: Secondary | ICD-10-CM

## 2023-11-08 DIAGNOSIS — K7581 Nonalcoholic steatohepatitis (NASH): Secondary | ICD-10-CM

## 2023-11-08 MED ORDER — REZDIFFRA 100 MG PO TABS: 100.0000 mg | ORAL_TABLET | Freq: Every day | ORAL | 11 refills | Status: AC

## 2023-11-13 ENCOUNTER — Telehealth (INDEPENDENT_AMBULATORY_CARE_PROVIDER_SITE_OTHER): Payer: Self-pay

## 2023-11-13 NOTE — Telephone Encounter (Signed)
 11/13/2023 Samantha Cress [place] [address] [place], [place] [address] Plan member ID: [id number] Case number: [id number] Prescriber name: Samantha Cress [facility] Prescriber fax: [phone] NOTICE OF DENIAL Dear Garnette Ka, On behalf of [employer], [facility] is responsible for reviewing pharmacy services provided to [facility] members. On 11/08/2023, we received a request from your prescriber for coverage of Rezdiffra  Tab 100mg . We reviewed all of the information you and/or your doctor sent to us  and sent the information to an appropriate physician specialist if needed. Unfortunately, we must deny coverage for Rezdiffra . Why was my request denied? This request was denied because you did not meet the following requirements: Based on the information provided, you do not meet the established medication-specific criteria or guidelines for Rezdiffra  at this time. 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 (B) One of the following tests [FibroScan aspartate aminotransferase (FAST), MRI aspartate aminotransferase (MAST), magnetic resonance elastography combined with fibrosis-4 index (MEFIB), liver biopsy within the past 12 months]. The information provided does not show that you meet the criteria listed above. Please speak with your doctor about your options. Reviewed by: R.Ph. This denial is based on our Rezdiffra  drug coverage policy, in addition to any supplementary information you or your prescriber may have submitted. How can I obtain the material(s) used to review this request? [phone] All [facility] trademarks and logos are owned by [employer]. All  other brand or product names are trademarks or registered marks of their respective owners. All rights reserved. Page 2 of 7 You may request, free of charge, a copy of the drug coverage policy, actual benefit provision, guideline, protocol or other information that factored into the decision, including the diagnosis code and the treatment code and their corresponding meanings, by calling us  at [phone], or by writing to the address below: [facility] c/o Prior Authorization Guidelines [address] [place], [place] [address] Please note that this decision only affects whether your prescription plan will pay for this medication. Only you and your prescriber can decide what is best for you and your treatment. You may still buy this medication (at full cost) at your local pharmacy. What if my prescriber wants to discuss this decision with a peer? Your prescriber may request to discuss this decision with a reviewing physician or other appropriate reviewer by contacting us  at [phone]. APPEAL PROCESS What if I don't agree with this decision? You have the right to appeal any decision that denies payment for an item or service (in whole or in part). You may also submit written comments, documents or other information relevant to the appeal. Who may file an appeal? You, your prescriber or your authorized representative (someone you name to act for you, such as a family member, an attorney or a friend) may file an appeal. [employer] reserves the right to establish and implement reasonable procedures to determine whether an individual has been authorized to act as Medical illustrator. How do I file an appeal? You have the right to appeal this medication coverage decision within 180 calendar days from the date of this denial notification. You or your prescriber can get appeals information, including independent appeal rights, by calling our appeals coordinator at the toll-free member number  listed on your health  plan ID card. You can also review your plan's prescription drug benefit information or contact your benefits office for more detailed information regarding the appeal process. To file an appeal, please send any written comments, documents or other relevant documentation with your appeal to the address listed below: [employer] [address] [place], [place] [address] Phone: Please call the toll-free member number listed on your health plan ID card. Fax: [phone] Expedited/Urgent Fax: [phone] How long does the appeals process take? If you proceed with the appeals process, [employer] will review the denial decision and provide All [facility] trademarks and logos are owned by [employer]. All other brand or product names are trademarks or registered marks of their respective owners. All rights reserved. Page 3 of 7 you with a written determination within 30 calendar days of receiving your appeal. If any of the following occurs, you may be able to request an external review of your claim by an independent third party, known as an independent review organization (IRO), which will review the denial and issue a final decision: A You do not receive a timely decision A [employer] continues to deny the payment, coverage or service requested after the final level of internal appeal A [employer] does not adhere to certain legal requirements regarding claims procedures What if my appeal is urgent? If your situation meets the definition of urgent under the law, your review will be rushed. Generally, an urgent situation is one in which the standard time frame for a decision: A Could seriously jeopardize your life or health or your ability to regain maximum function, based on a prudent layperson's judgment A In the opinion of a practitioner with knowledge of your medical condition, would subject you to severe pain that cannot be adequately  managed without the care or treatment that is the subject of the request If you believe your situation is urgent, you may request an expedited appeal by calling [employer] at the toll-free member number listed on your health plan ID card. You will be notified of the result of your expedited appeal within 72 hours from the receipt of the appeal request. If you are in an urgent situation, you may be allowed to proceed with an expedited external review at the same time as the internal appeals process under your plan. EXTERNAL REVIEW PROCESS What is an external review? An external review is a complete re-examination of your case by an independent review organization (IRO). Who may file an external review? You, your prescriber or your authorized representative (someone you name to act for you, such as a family member, an attorney or a friend) may request an external review. [employer] reserves the right to establish and implement reasonable procedures to determine whether an individual has been authorized to act as Medical illustrator. How do I file an external review? To file an external review, you must send [employer] a letter within four (4) months of receiving the final letter of denial (or if you meet the situation above under "What if my appeal request is urgent?") and explain the reason for your disagreement with this denial decision. You are not required to bear any costs, including filing fees, when requesting a case to be sent for external review to an IRO. All [facility] trademarks and logos are owned by [employer]. All other brand or product names are trademarks or registered marks of their respective owners. All rights reserved. Page 4 of 7 [employer] will forward your letter and the entire case file to  the IRO within five (5) business days of receiving your information, or within two (2) business days for an expedited external review. Upon receiving your  information, the IRO will notify you whether your request is eligible and accepted for an external review. Once you receive this letter, you have 10 business days to submit (in writing) additional information for the IRO to consider in its review. The IRO will provide you a written notice of the final external review decision within 45 calendar days after the IRO receives the request for the external review, or within 72 hours for urgent requests. If the IRO overturns the denial, [employer] will authorize or pay for the services in question. To file an external appeal and provide additional information about your request, please send any written comments, documents or other relevant documentation with your appeal to the address listed below: [facility] External Review Request [address] [address] [place], [place] [address] Phone: Please call the toll-free member number listed on your health plan ID card. Fax: [phone] What if my external review request is urgent? You may request an expedited external review by contacting us  within four (4) months of the date of this notice by calling the toll-free member number listed on your health plan ID card or by faxing [phone]. OTHER RESOURCES TO HELP YOU (EMPLOYER GROUP PLAN) You and your plan may have other voluntary alternative dispute resolution options, such as mediation. For questions about your appeal rights or this notice, or for assistance, you can contact the Employee Benefits Security Administration at [phone]). You may contact consumer assistance program to assist you at: [place] Department of [employer] Employee Benefits Security Administration [facility] [address] [place] [address] [phone] 444-EBSA (231)[phone]) [employer] Other member rights All [facility] trademarks and logos are owned by [employer]. All other brand or product names  are trademarks or registered marks of their respective owners. All rights reserved. Page 5 of 7 This document and others if attached contain information from [facility] that is proprietary, confidential and/or may contain protected health information (PHI). We are required to safeguard PHI by applicable law. The information in this document is for the sole use of the person(s) or company named above. If you received this document by mistake, please know that sharing, copying, distributing or using information in this document is against the law. If you are not the intended recipient, please notify the sender immediately and return the document(s) by mail to [facility] [address], [place], [place] [address]. If you do not agree with the decision of your appeal, you may have the right to file a civil action under Section 502(a) of ERISA if all required reviews of your claim have been completed. Please ask the plan administrator named in your Summary Plan Description (SPD) for the limitations period that applies to your right to bring such an action. Who reviewed this request? This request has been reviewed by a physician or pharmacist, who is a reviewer for [facility] on behalf of [employer], with the appropriate qualifications. If you have any additional questions, please call us  toll-free at [phone]. Sincerely, [facility] cc: [employer]

## 2023-11-13 NOTE — Telephone Encounter (Signed)
 Thanks for the update

## 2023-11-15 NOTE — Patient Instructions (Signed)
 Aaron Chambers  11/15/2023     @PREFPERIOPPHARMACY @   Your procedure is scheduled on  11/19/2023.   Report to [facility] at   0700  A.M.   Call this number if you have problems the morning of surgery:  [phone]  If you experience any cold or flu symptoms such as cough, fever, chills, shortness of breath, etc. between now and your scheduled surgery, please notify us  at the above number.   Remember:         Your last dose of semaglutide or mounjaro should have been on 11/11/2023.        DO NOT take any medications for diabetes the morning of your procedure.    Do not after midnight.    You may drink clear liquids until 0500 am on 11/19/2023.    Clear liquids allowed are:                    Water, Juice (No red color; non-citric and without pulp; diabetics please choose diet or no sugar options), Carbonated beverages (diabetics please choose diet or no sugar options), Clear Tea (No creamer, milk, or cream, including half & half and powdered creamer), Black Coffee Only (No creamer, milk or cream, including half & half and powdered creamer), and Clear Sports drink (No red color; diabetics please choose diet or no sugar options)    Take these medicines the morning of surgery with A SIP OF WATER                  bupropion , clonazepam , lamotrigine , venlafaxine.    Do not wear jewelry, make-up or nail polish, including gel polish,  artificial nails, or any other type of covering on natural nails (fingers and  toes).  Do not wear lotions, powders, or perfumes, or deodorant.  Do not shave 48 hours prior to surgery.  Men may shave face and neck.  Do not bring valuables to the hospital.  [facility] is not responsible for any belongings or valuables.  Contacts, dentures or bridgework may not be worn into surgery.  Leave your suitcase in the car.  After surgery it may be brought to your room.  For patients admitted to the hospital, discharge time will be determined by your  treatment team.  Patients discharged the day of surgery will not be allowed to drive home and must have someone with them for 24 hours.    Special instructions:   DO NOT smoke tobacco or vape for 24 hours before your procedure.  Please read over the following fact sheets that you were given. Coughing and Deep Breathing, Surgical Site Infection Prevention, Anesthesia Post-op Instructions, and Care and Recovery After Surgery       Laparoscopic Surgery for Belly Hernias: What to Know After After the procedure, it's common to have pain, discomfort, or soreness. Follow these instructions at home: Medicines Take your medicines only as told. You may need to take steps to help treat or prevent trouble pooping (constipation), such as: Taking medicines to help you poop. Eating foods high in fiber, like beans, whole grains, and fresh fruits and vegetables. Drinking more fluids as told. Ask your health care provider if it's safe to drive or use machines while taking your medicine. Incision care  Take care of the cuts in your belly as told. Make sure you: Wash your hands with soap and water for at least 20 seconds before and after you change your bandage. If  you can't use soap and water, use hand sanitizer. Change your bandage. Leave stitches or skin glue alone. Leave tape strips alone unless you're told to take them off. You may trim the edges of the tape strips if they curl up. Check the cuts on your belly every day for signs of infection. Check for: More redness, swelling, or pain. More fluid or blood. Warmth. Pus or a bad smell. Activity Rest as told. Get up to take short walks at least every 2 hours during the day. This helps you breathe better and keeps your blood flowing. Ask for help if you feel weak or unsteady. Do not take baths, swim, or use a hot tub until you're told it's OK. Ask if you can shower. Ask if it's OK for you to lift. If you were given a sedative, do not drive or  use machines until you're told it's safe. A sedative can make you sleepy. Ask what things are safe for you to do at home. Ask when you can go back to work or school. General instructions Hold a pillow over your belly when you cough or sneeze. This helps with pain. Wear a binder around your belly as told by your provider. Do not smoke, vape, or use nicotine or tobacco. Wear compression stockings to reduce swelling and help prevent blood clots in your legs. You may be asked to continue to do deep breathing exercises at home. This will help to prevent a lung infection. Contact a health care provider if: You have any signs of infection. You have pain that gets worse or does not get better with medicine. You throw up or you feel like throwing up. You have a cough. You have not pooped in 3 days. You are not able to pee. You have a fever. Get help right away if: You have very bad pain in your belly. You throw up every time you eat or drink. You have redness, warmth, or pain in your leg. You have chest pain. You have trouble breathing. These symptoms may be an emergency. Call 911 right away. Do not wait to see if the symptoms will go away. Do not drive yourself to the hospital. This information is not intended to replace advice given to you by your health care provider. Make sure you discuss any questions you have with your health care provider. Document Revised: 01/02/2023 Document Reviewed: 01/02/2023 Elsevier Patient Education  2024 [employer] Inc.General Anesthesia, Adult, Care After The following information offers guidance on how to care for yourself after your procedure. Your health care provider may also give you more specific instructions. If you have problems or questions, contact your health care provider. What can I expect after the procedure? After the procedure, it is common for people to: Have pain or discomfort at the IV site. Have nausea or vomiting. Have a sore throat or  hoarseness. Have trouble concentrating. Feel cold or chills. Feel weak, sleepy, or tired (fatigue). Have soreness and body aches. These can affect parts of the body that were not involved in surgery. Follow these instructions at home: For the time period you were told by your health care provider:  Rest. Do not participate in activities where you could fall or become injured. Do not drive or use machinery. Do not drink alcohol. Do not take sleeping pills or medicines that cause drowsiness. Do not make important decisions or sign legal documents. Do not take care of children on your own. General instructions Drink enough fluid to keep your  urine pale yellow. If you have sleep apnea, surgery and certain medicines can increase your risk for breathing problems. Follow instructions from your health care provider about wearing your sleep device: Anytime you are sleeping, including during daytime naps. While taking prescription pain medicines, sleeping medicines, or medicines that make you drowsy. Return to your normal activities as told by your health care provider. Ask your health care provider what activities are safe for you. Take over-the-counter and prescription medicines only as told by your health care provider. Do not use any products that contain nicotine or tobacco. These products include cigarettes, chewing tobacco, and vaping devices, such as e-cigarettes. These can delay incision healing after surgery. If you need help quitting, ask your health care provider. Contact a health care provider if: You have nausea or vomiting that does not get better with medicine. You vomit every time you eat or drink. You have pain that does not get better with medicine. You cannot urinate or have bloody urine. You develop a skin rash. You have a fever. Get help right away if: You have trouble breathing. You have chest pain. You vomit blood. These symptoms may be an emergency. Get help right  away. Call 911. Do not wait to see if the symptoms will go away. Do not drive yourself to the hospital. Summary After the procedure, it is common to have a sore throat, hoarseness, nausea, vomiting, or to feel weak, sleepy, or fatigue. For the time period you were told by your health care provider, do not drive or use machinery. Get help right away if you have difficulty breathing, have chest pain, or vomit blood. These symptoms may be an emergency. This information is not intended to replace advice given to you by your health care provider. Make sure you discuss any questions you have with your health care provider. Document Revised: 09/23/2021 Document Reviewed: 09/23/2021 Elsevier Patient Education  2024 Elsevier Inc.How to Use Chlorhexidine at Home in the Shower Chlorhexidine gluconate (CHG) is a germ-killing (antiseptic) wash that's used to clean the skin. It can get rid of the germs that normally live on the skin and can keep them away for about 24 hours. If you're having surgery, you may be told to shower with CHG at home the night before surgery. This can help lower your risk for infection. To use CHG wash in the shower, follow the steps below. Supplies needed: CHG body wash. Clean washcloth. Clean towel. How to use CHG in the shower Follow these steps unless you're told to use CHG in a different way: Start the shower. Use your normal soap and shampoo to wash your face and hair. Turn off the shower or move out of the shower stream. Pour CHG onto a clean washcloth. Do not use any type of brush or rough sponge. Start at your neck, washing your body down to your toes. Make sure you: Wash the part of your body where the surgery will be done for at least 1 minute. Do not scrub. Do not use CHG on your head or face unless your health care provider tells you to. If it gets into your ears or eyes, rinse them well with water. Do not wash your genitals with CHG. Wash your back and under your  arms. Make sure to wash skin folds. Let the CHG sit on your skin for 1-2 minutes or as long as told. Rinse your entire body in the shower, including all body creases and folds. Turn off the shower. Dry off  with a clean towel. Do not put anything on your skin afterward, such as powder, lotion, or perfume. Put on clean clothes or pajamas. If it's the night before surgery, sleep in clean sheets. General tips Use CHG only as told, and follow the instructions on the label. Use the full amount of CHG as told. This is often one bottle. Do not smoke and stay away from flames after using CHG. Your skin may feel sticky after using CHG. This is normal. The sticky feeling will go away as the CHG dries. Do not use CHG: If you have a chlorhexidine allergy or have reacted to chlorhexidine in the past. On open wounds or areas of skin that have broken skin, cuts, or scrapes. On babies younger than 66 months of age. Contact a health care provider if: You have questions about using CHG. Your skin gets irritated or itchy. You have a rash after using CHG. You swallow any CHG. Call your local poison control center [phone] in the [place] Your eyes itch badly, or they become very red or swollen. Your hearing changes. You have trouble seeing. If you can't reach your provider, go to an urgent care or emergency room. Do not drive yourself. Get help right away if: You have swelling or tingling in your mouth or throat. You make high-pitched whistling sounds when you breathe, most often when you breathe out (wheeze). You have trouble breathing. These symptoms may be an emergency. Call 911 right away. Do not wait to see if the symptoms will go away. Do not drive yourself to the hospital. This information is not intended to replace advice given to you by your health care provider. Make sure you discuss any questions you have with your health care provider. Document Revised: 01/09/2023 Document Reviewed:  01/05/2022 Elsevier Patient Education  2024 [employer].

## 2023-11-16 ENCOUNTER — Other Ambulatory Visit: Payer: Self-pay

## 2023-11-16 ENCOUNTER — Encounter (HOSPITAL_COMMUNITY): Payer: Self-pay

## 2023-11-16 ENCOUNTER — Encounter (HOSPITAL_COMMUNITY)
Admission: RE | Admit: 2023-11-16 | Discharge: 2023-11-16 | Disposition: A | Discharge status: Home / Self Care | Source: Ambulatory Visit | Attending: General Surgery | Admitting: General Surgery

## 2023-11-16 VITALS — Ht 76.0 in | Wt 350.0 lb

## 2023-11-16 DIAGNOSIS — Z01818 Encounter for other preprocedural examination: Secondary | ICD-10-CM | POA: Insufficient documentation

## 2023-11-16 DIAGNOSIS — K7581 Nonalcoholic steatohepatitis (NASH): Secondary | ICD-10-CM | POA: Insufficient documentation

## 2023-11-16 DIAGNOSIS — E119 Type 2 diabetes mellitus without complications: Secondary | ICD-10-CM | POA: Diagnosis not present

## 2023-11-16 HISTORY — DX: Personal history of urinary calculi: Z87.442

## 2023-11-16 LAB — CBC WITH DIFFERENTIAL/PLATELET
Abs Immature Granulocytes: 0.01 10*3/uL (ref 0.00–0.07)
Basophils Absolute: 0 10*3/uL (ref 0.0–0.1)
Basophils Relative: 0 %
Eosinophils Absolute: 0.2 10*3/uL (ref 0.0–0.5)
Eosinophils Relative: 2 %
HCT: 41.1 % (ref 39.0–52.0)
Hemoglobin: 13.6 g/dL (ref 13.0–17.0)
Immature Granulocytes: 0 %
Lymphocytes Relative: 32 %
Lymphs Abs: 2.6 10*3/uL (ref 0.7–4.0)
MCH: 28.2 pg (ref 26.0–34.0)
MCHC: 33.1 g/dL (ref 30.0–36.0)
MCV: 85.3 fL (ref 80.0–100.0)
Monocytes Absolute: 0.5 10*3/uL (ref 0.1–1.0)
Monocytes Relative: 6 %
Neutro Abs: 4.7 10*3/uL (ref 1.7–7.7)
Neutrophils Relative %: 60 %
Platelets: 181 10*3/uL (ref 150–400)
RBC: 4.82 MIL/uL (ref 4.22–5.81)
RDW: 13.8 % (ref 11.5–15.5)
WBC: 7.9 10*3/uL (ref 4.0–10.5)
nRBC: 0 % (ref 0.0–0.2)

## 2023-11-16 LAB — COMPREHENSIVE METABOLIC PANEL WITH GFR
ALT: 68 U/L — ABNORMAL HIGH (ref 0–44)
AST: 43 U/L — ABNORMAL HIGH (ref 15–41)
Albumin: 4 g/dL (ref 3.5–5.0)
Alkaline Phosphatase: 73 U/L (ref 38–126)
Anion gap: 9 (ref 5–15)
BUN: 11 mg/dL (ref 6–20)
CO2: 24 mmol/L (ref 22–32)
Calcium: 8.9 mg/dL (ref 8.9–10.3)
Chloride: 106 mmol/L (ref 98–111)
Creatinine, Ser: 0.65 mg/dL (ref 0.61–1.24)
GFR, Estimated: >60 mL/min (ref >60)
Glucose, Bld: 191 mg/dL — ABNORMAL HIGH (ref 70–99)
Potassium: 3.9 mmol/L (ref 3.5–5.1)
Sodium: 139 mmol/L (ref 135–145)
Total Bilirubin: 0.7 mg/dL (ref 0.0–1.2)
Total Protein: 6.7 g/dL (ref 6.5–8.1)

## 2023-11-16 LAB — HEMOGLOBIN A1C
Hgb A1c MFr Bld: 6.1 % — ABNORMAL HIGH (ref 4.8–5.6)
Mean Plasma Glucose: 128.37 mg/dL

## 2023-11-16 LAB — PROTIME-INR
INR: 1 (ref 0.8–1.2)
Prothrombin Time: 13 s (ref 11.4–15.2)

## 2023-11-19 ENCOUNTER — Encounter (HOSPITAL_COMMUNITY): Payer: Self-pay | Admitting: General Surgery

## 2023-11-19 ENCOUNTER — Ambulatory Visit (HOSPITAL_COMMUNITY)
Admission: RE | Admit: 2023-11-19 | Discharge: 2023-11-19 | Disposition: A | Discharge status: Home / Self Care | Attending: General Surgery | Admitting: General Surgery

## 2023-11-19 ENCOUNTER — Ambulatory Visit (HOSPITAL_COMMUNITY): Admitting: Certified Registered Nurse Anesthetist

## 2023-11-19 ENCOUNTER — Ambulatory Visit (HOSPITAL_BASED_OUTPATIENT_CLINIC_OR_DEPARTMENT_OTHER): Admitting: Certified Registered Nurse Anesthetist

## 2023-11-19 ENCOUNTER — Encounter (HOSPITAL_COMMUNITY)
Admission: RE | Disposition: A | Discharge status: Home / Self Care | Payer: Self-pay | Source: Home / Self Care | Attending: General Surgery

## 2023-11-19 DIAGNOSIS — G4730 Sleep apnea, unspecified: Secondary | ICD-10-CM | POA: Diagnosis not present

## 2023-11-19 DIAGNOSIS — J45909 Unspecified asthma, uncomplicated: Secondary | ICD-10-CM | POA: Insufficient documentation

## 2023-11-19 DIAGNOSIS — K429 Umbilical hernia without obstruction or gangrene: Secondary | ICD-10-CM | POA: Diagnosis not present

## 2023-11-19 DIAGNOSIS — N289 Disorder of kidney and ureter, unspecified: Secondary | ICD-10-CM | POA: Diagnosis not present

## 2023-11-19 DIAGNOSIS — K420 Umbilical hernia with obstruction, without gangrene: Secondary | ICD-10-CM | POA: Insufficient documentation

## 2023-11-19 DIAGNOSIS — G4733 Obstructive sleep apnea (adult) (pediatric): Secondary | ICD-10-CM | POA: Diagnosis not present

## 2023-11-19 DIAGNOSIS — J4489 Other specified chronic obstructive pulmonary disease: Secondary | ICD-10-CM | POA: Diagnosis not present

## 2023-11-19 DIAGNOSIS — Z7985 Long-term (current) use of injectable non-insulin antidiabetic drugs: Secondary | ICD-10-CM | POA: Insufficient documentation

## 2023-11-19 DIAGNOSIS — E6689 Other obesity not elsewhere classified: Secondary | ICD-10-CM | POA: Diagnosis not present

## 2023-11-19 DIAGNOSIS — F419 Anxiety disorder, unspecified: Secondary | ICD-10-CM | POA: Insufficient documentation

## 2023-11-19 DIAGNOSIS — E119 Type 2 diabetes mellitus without complications: Secondary | ICD-10-CM | POA: Insufficient documentation

## 2023-11-19 DIAGNOSIS — I10 Essential (primary) hypertension: Secondary | ICD-10-CM

## 2023-11-19 DIAGNOSIS — Z7984 Long term (current) use of oral hypoglycemic drugs: Secondary | ICD-10-CM | POA: Insufficient documentation

## 2023-11-19 LAB — GLUCOSE, CAPILLARY
Glucose-Capillary: 188 mg/dL — ABNORMAL HIGH (ref 70–99)
Glucose-Capillary: 146 mg/dL — ABNORMAL HIGH (ref 70–99)

## 2023-11-19 SURGERY — REPAIR, HERNIA, UMBILICAL, ROBOT-ASSISTED
Anesthesia: General | Site: Abdomen

## 2023-11-19 MED ORDER — STERILE WATER FOR IRRIGATION IR SOLN
Status: DC | PRN
  Administered 2023-11-19: 500 mL

## 2023-11-19 MED ORDER — LACTATED RINGERS IV SOLN: INTRAVENOUS | Status: DC | PRN

## 2023-11-19 MED ORDER — ONDANSETRON HCL 4 MG/2ML IJ SOLN
INTRAMUSCULAR | Status: DC | PRN
  Administered 2023-11-19: 4 mg via INTRAVENOUS

## 2023-11-19 MED ORDER — DEXMEDETOMIDINE HCL IN NACL 80 MCG/20ML IV SOLN
INTRAVENOUS | Status: AC
  Filled 2023-11-19: qty 20

## 2023-11-19 MED ORDER — PROPOFOL 10 MG/ML IV BOLUS
INTRAVENOUS | Status: AC
  Filled 2023-11-19: qty 20

## 2023-11-19 MED ORDER — BUPIVACAINE HCL (PF) 0.5 % IJ SOLN
INTRAMUSCULAR | Status: AC
  Filled 2023-11-19: qty 30

## 2023-11-19 MED ORDER — SUGAMMADEX SODIUM 200 MG/2ML IV SOLN
INTRAVENOUS | Status: DC | PRN
  Administered 2023-11-19: 150 mg via INTRAVENOUS
  Administered 2023-11-19: 200 mg via INTRAVENOUS

## 2023-11-19 MED ORDER — CHLORHEXIDINE GLUCONATE 0.12 % MT SOLN
15.0000 mL | Freq: Once | OROMUCOSAL | Status: AC
  Administered 2023-11-19: 15 mL via OROMUCOSAL

## 2023-11-19 MED ORDER — SODIUM CHLORIDE 0.9 % IV SOLN
3.0000 g | INTRAVENOUS | Status: AC
  Administered 2023-11-19: 3 g via INTRAVENOUS
  Filled 2023-11-19: qty 3

## 2023-11-19 MED ORDER — MIDAZOLAM HCL 2 MG/2ML IJ SOLN
INTRAMUSCULAR | Status: AC
  Filled 2023-11-19: qty 2

## 2023-11-19 MED ORDER — PROPOFOL 10 MG/ML IV BOLUS
INTRAVENOUS | Status: DC | PRN
  Administered 2023-11-19: 200 mg via INTRAVENOUS

## 2023-11-19 MED ORDER — DEXAMETHASONE SODIUM PHOSPHATE 10 MG/ML IJ SOLN
INTRAMUSCULAR | Status: DC | PRN
  Administered 2023-11-19: 10 mg via INTRAVENOUS

## 2023-11-19 MED ORDER — ROCURONIUM BROMIDE 10 MG/ML (PF) SYRINGE
PREFILLED_SYRINGE | INTRAVENOUS | Status: DC | PRN
Start: 2023-11-19 — End: 2023-11-19
  Administered 2023-11-19: 20 mg via INTRAVENOUS
  Administered 2023-11-19: 100 mg via INTRAVENOUS

## 2023-11-19 MED ORDER — FENTANYL CITRATE (PF) 250 MCG/5ML IJ SOLN
INTRAMUSCULAR | Status: AC
  Filled 2023-11-19: qty 5

## 2023-11-19 MED ORDER — MIDAZOLAM HCL 5 MG/5ML IJ SOLN
INTRAMUSCULAR | Status: DC | PRN
Start: 2023-11-19 — End: 2023-11-19
  Administered 2023-11-19: 2 mg via INTRAVENOUS

## 2023-11-19 MED ORDER — DEXMEDETOMIDINE HCL IN NACL 80 MCG/20ML IV SOLN
INTRAVENOUS | Status: DC | PRN
  Administered 2023-11-19: 4 ug via INTRAVENOUS
  Administered 2023-11-19: 12 ug via INTRAVENOUS

## 2023-11-19 MED ORDER — KETOROLAC TROMETHAMINE 30 MG/ML IJ SOLN: 30.0000 mg | Freq: Once | INTRAMUSCULAR | Status: DC

## 2023-11-19 MED ORDER — OXYCODONE HCL 5 MG PO TABS: 5.0000 mg | ORAL_TABLET | ORAL | 0 refills | Status: DC | PRN

## 2023-11-19 MED ORDER — BUPIVACAINE HCL (PF) 0.5 % IJ SOLN
INTRAMUSCULAR | Status: DC | PRN
  Administered 2023-11-19: 30 mL

## 2023-11-19 MED ORDER — LIDOCAINE 2% (20 MG/ML) 5 ML SYRINGE
INTRAMUSCULAR | Status: DC | PRN
  Administered 2023-11-19: 100 mg via INTRAVENOUS

## 2023-11-19 MED ORDER — FENTANYL CITRATE (PF) 100 MCG/2ML IJ SOLN
INTRAMUSCULAR | Status: DC | PRN
  Administered 2023-11-19 (×4): 50 ug via INTRAVENOUS

## 2023-11-19 MED ORDER — CHLORHEXIDINE GLUCONATE CLOTH 2 % EX PADS
6.0000 | MEDICATED_PAD | Freq: Once | CUTANEOUS | Status: AC
  Administered 2023-11-19: 6 via TOPICAL

## 2023-11-19 MED ORDER — CHLORHEXIDINE GLUCONATE CLOTH 2 % EX PADS: 6.0000 | MEDICATED_PAD | Freq: Once | CUTANEOUS | Status: DC

## 2023-11-19 SURGICAL SUPPLY — 36 items
CHLORAPREP W/TINT 26 (MISCELLANEOUS) ×1 IMPLANT
COVER LIGHT HANDLE STERIS (MISCELLANEOUS) ×1 IMPLANT
COVER MAYO STAND XLG (MISCELLANEOUS) ×1 IMPLANT
COVER TIP SHEARS 8 DVNC (MISCELLANEOUS) ×1 IMPLANT
DERMABOND ADVANCED .7 DNX12 (GAUZE/BANDAGES/DRESSINGS) ×1 IMPLANT
DRAPE ARM DVNC X/XI (DISPOSABLE) ×3 IMPLANT
DRAPE COLUMN DVNC XI (DISPOSABLE) ×1 IMPLANT
DRIVER NDL MEGA SUTCUT DVNCXI (INSTRUMENTS) ×1 IMPLANT
DRIVER NDLE MEGA SUTCUT DVNCXI (INSTRUMENTS) ×1 IMPLANT
DRSG TEGADERM 4X4.75 (GAUZE/BANDAGES/DRESSINGS) IMPLANT
ELECTRODE REM PT RTRN 9FT ADLT (ELECTROSURGICAL) ×1 IMPLANT
FORCEPS BPLR R/ABLATION 8 DVNC (INSTRUMENTS) ×1 IMPLANT
GAUZE SPONGE 4X4 12PLY STRL (GAUZE/BANDAGES/DRESSINGS) IMPLANT
GLOVE BIOGEL PI IND STRL 7.0 (GLOVE) ×3 IMPLANT
GLOVE SURG SS PI 7.5 STRL IVOR (GLOVE) ×2 IMPLANT
GOWN STRL REUS W/TWL LRG LVL3 (GOWN DISPOSABLE) ×3 IMPLANT
MANIFOLD NEPTUNE II (INSTRUMENTS) ×1 IMPLANT
MESH VENTRALIGHT ST 4.5IN (Mesh General) IMPLANT
NDL HYPO 21X1.5 SAFETY (NEEDLE) ×1 IMPLANT
NDL INSUFFLATION 14GA 120MM (NEEDLE) ×1 IMPLANT
NEEDLE HYPO 21X1.5 SAFETY (NEEDLE) ×1 IMPLANT
NEEDLE INSUFFLATION 14GA 120MM (NEEDLE) ×1 IMPLANT
OBTURATOR OPTICALSTD 8 DVNC (TROCAR) ×1 IMPLANT
PACK LAP CHOLE LZT030E (CUSTOM PROCEDURE TRAY) ×1 IMPLANT
PENCIL HANDSWITCHING (ELECTRODE) ×1 IMPLANT
POSITIONER HEAD 8X9X4 ADT (SOFTGOODS) ×1 IMPLANT
SCISSORS MNPLR CVD DVNC XI (INSTRUMENTS) ×1 IMPLANT
SEAL UNIV 5-12 XI (MISCELLANEOUS) ×3 IMPLANT
SET TUBE DA VINCI INSUFFLATOR (TUBING) IMPLANT
SUT MNCRL AB 4-0 PS2 18 (SUTURE) ×2 IMPLANT
SUT STRATA 3-0 SH (SUTURE) ×6 IMPLANT
SUTURE STRATFX 0 PDS+ CT-2 23 (SUTURE) ×1 IMPLANT
SYR 30ML LL (SYRINGE) ×1 IMPLANT
TAPE TRANSPORE STRL 2 31045 (GAUZE/BANDAGES/DRESSINGS) ×1 IMPLANT
TRAY FOLEY W/BAG SLVR 16FR ST (SET/KITS/TRAYS/PACK) ×1 IMPLANT
WATER STERILE IRR 500ML POUR (IV SOLUTION) ×1 IMPLANT

## 2023-11-19 NOTE — Discharge Instructions (Signed)
 GENERAL POST-OPERATIVE PATIENT INSTRUCTIONS   FOLLOW-UP:  Please make an appointment with your physician in 1PATIENT INSTRUCTIONS HERNIA  FOLLOW-UP:  Please make an appointment with your physician in 1 day(s).  Call your physician immediately if you have any fevers greater than 102.5, drainage from you wound that is not clear or looks infected, persistent bleeding, increasing abdominal pain, problems urinating, or persistent nausea/vomiting.    WOUND CARE INSTRUCTIONS:  Keep a dry clean dressing on the wound if there is drainage. The initial bandage may be removed after 24 hours.  Once the wound has quit draining you may leave it open to air.  If clothing rubs against the wound or causes irritation and the wound is not draining you may cover it with a dry dressing during the daytime.  Try to keep the wound dry and avoid ointments on the wound unless directed to do so.  If the wound becomes bright red and painful or starts to drain infected material that is not clear, please contact your physician immediately.  If the wound is mildly pink and has a thick firm ridge underneath it, this is normal, and is referred to as a healing ridge.  This will resolve over the next 4-6 weeks.  DIET:  You may eat any foods that you can tolerate.  It is a good idea to eat a high fiber diet and take in plenty of fluids to prevent constipation.  If you do become constipated you may want to take a mild laxative or take ducolax tablets on a daily basis until your bowel habits are regular.  Constipation can be very uncomfortable, along with straining, after recent abdominal surgery.  ACTIVITY:  You are encouraged to cough and deep breath or use your incentive spirometer if you were given one, every 15-30 minutes when awake.  This will help prevent respiratory complications and low grade fevers post-operatively.  You may want to hug a pillow when coughing and sneezing to add additional support to the surgical area which will  decrease pain during these times.  You are encouraged to walk and engage in light activity for the next two weeks.  You should not lift more than 20 pounds during this time frame as it could put you at increased risk for a hernia recurrence.  Twenty pounds is roughly equivalent to a plastic bag of groceries.    MEDICATIONS:  Try to take narcotic medications and anti-inflammatory medications, such as tylenol , ibuprofen, naprosyn, etc., with food.  This will minimize stomach upset from the medication.  Should you develop nausea and vomiting from the pain medication, or develop a rash, please discontinue the medication and contact your physician.  You should not drive, make important decisions, or operate machinery when taking narcotic pain medication.  QUESTIONS:  Please feel free to call your physician or the hospital operator if you have any questions, and they will be glad to assist you.   day(s).  Call your physician immediately if you have any fevers greater than 102.5, drainage from you wound that is not clear or looks infected, persistent bleeding, increasing abdominal pain, problems urinating, or persistent nausea/vomiting.    WOUND CARE INSTRUCTIONS:  Keep a dry clean dressing on the wound if there is drainage. The initial bandage may be removed after 24 hours.  Once the wound has quit draining you may leave it open to air.  If clothing rubs against the wound or causes irritation and the wound is not draining you may cover it  with a dry dressing during the daytime.  Try to keep the wound dry and avoid ointments on the wound unless directed to do so.  If the wound becomes bright red and painful or starts to drain infected material that is not clear, please contact your physician immediately.  If the wound is mildly pink and has a thick firm ridge underneath it, this is normal, and is referred to as a healing ridge.  This will resolve over the next 4-6 weeks.  DIET:  You may eat any foods that you can  tolerate.  It is a good idea to eat a high fiber diet and take in plenty of fluids to prevent constipation.  If you do become constipated you may want to take a mild laxative or take ducolax tablets on a daily basis until your bowel habits are regular.  Constipation can be very uncomfortable, along with straining, after recent surgery.  ACTIVITY:  You are encouraged to cough and deep breath or use your incentive spirometer if you were given one, every 15-30 minutes when awake.  This will help prevent respiratory complications and low grade fevers post-operatively if you had a general anesthetic.  You may want to hug a pillow when coughing and sneezing to add additional support to the surgical area, if you had abdominal or chest surgery, which will decrease pain during these times.  You are encouraged to walk and engage in light activity for the next two weeks.  You should not lift more than 20 pounds during this time frame as it could put you at increased risk for complications.  Twenty pounds is roughly equivalent to a plastic bag of groceries.    MEDICATIONS:  Try to take narcotic medications and anti-inflammatory medications, such as tylenol , ibuprofen, naprosyn, etc., with food.  This will minimize stomach upset from the medication.  Should you develop nausea and vomiting from the pain medication, or develop a rash, please discontinue the medication and contact your physician.  You should not drive, make important decisions, or operate machinery when taking narcotic pain medication.  QUESTIONS:  Please feel free to call your physician or the hospital operator if you have any questions, and they will be glad to assist you.

## 2023-11-19 NOTE — Anesthesia Procedure Notes (Signed)
 Procedure Name: Intubation Date/Time: 11/19/2023 9:06 AM  Performed by: Coretha Dew, MDPre-anesthesia Checklist: Patient identified, Emergency Drugs available, Suction available and Patient being monitored Patient Re-evaluated:Patient Re-evaluated prior to induction Oxygen  Delivery Method: Circle system utilized Preoxygenation: Pre-oxygenation with 100% oxygen  Induction Type: IV induction Ventilation: Two handed mask ventilation required and Oral airway inserted - appropriate to patient size Laryngoscope Size: Glidescope and 4 Grade View: Grade I Tube type: Oral Tube size: 7.5 mm Number of attempts: 2 Airway Equipment and Method: Rigid stylet, Video-laryngoscopy and Oral airway Placement Confirmation: ETT inserted through vocal cords under direct vision, positive ETCO2 and breath sounds checked- equal and bilateral Secured at: 25 cm Tube secured with: Tape Dental Injury: Teeth and Oropharynx as per pre-operative assessment

## 2023-11-19 NOTE — Interval H&P Note (Signed)
 History and Physical Interval Note:  11/19/2023 8:37 AM  Aaron Chambers  has presented today for surgery, with the diagnosis of HERNIA,UMBILICAL, 3-10 CM.  The various methods of treatment have been discussed with the patient and family. After consideration of risks, benefits and other options for treatment, the patient has consented to  Procedure(s) with comments: REPAIR, HERNIA, UMBILICAL, ROBOT-ASSISTED (N/A) - W/ Mesh as a surgical intervention.  The patient's history has been reviewed, patient examined, no change in status, stable for surgery.  I have reviewed the patient's chart and labs.  Questions were answered to the patient's satisfaction.     Alanda Allegra

## 2023-11-19 NOTE — Transfer of Care (Signed)
 Immediate Anesthesia Transfer of Care Note  Patient: Aaron Chambers  Procedure(s) Performed: REPAIR, HERNIA, UMBILICAL, ROBOT-ASSISTED (Abdomen)  Patient Location: PACU  Anesthesia Type:General  Level of Consciousness: awake, alert , oriented, patient cooperative, and responds to stimulation  Airway & Oxygen  Therapy: Patient Spontanous Breathing and Patient connected to face mask oxygen   Post-op Assessment: Report given to RN, Post -op Vital signs reviewed and stable, and Patient moving all extremities X 4  Post vital signs: Reviewed and stable  Last Vitals:  Vitals Value Taken Time  BP 158/93 11/19/23 1045  Temp    Pulse 83 11/19/23 1047  Resp 18 11/19/23 1045  SpO2 96 % 11/19/23 1047  Vitals shown include unfiled device data.  Last Pain:  Vitals:   11/19/23 0804  PainSc: 0-No pain         Complications: No notable events documented.

## 2023-11-19 NOTE — Anesthesia Preprocedure Evaluation (Addendum)
 Anesthesia Evaluation  Patient identified by MRN, date of birth, ID band Patient awake    Reviewed: Allergy & Precautions, H&P , NPO status , Patient's Chart, lab work & pertinent test results, reviewed documented beta blocker date and time   Airway Mallampati: II  TM Distance: >3 FB Neck ROM: full    Dental no notable dental hx.    Pulmonary neg pulmonary ROS, asthma , sleep apnea    Pulmonary exam normal breath sounds clear to auscultation       Cardiovascular Exercise Tolerance: Good hypertension, negative cardio ROS  Rhythm:regular Rate:Normal     Neuro/Psych  PSYCHIATRIC DISORDERS Anxiety     negative neurological ROS  negative psych ROS   GI/Hepatic negative GI ROS, Neg liver ROS,,,(+) Hepatitis -  Endo/Other  diabetes  Class 4 obesity  Renal/GU Renal diseasenegative Renal ROS  negative genitourinary   Musculoskeletal   Abdominal   Peds  Hematology negative hematology ROS (+)   Anesthesia Other Findings   Reproductive/Obstetrics negative OB ROS                             Anesthesia Physical Anesthesia Plan  ASA: 3  Anesthesia Plan: General and General ETT   Post-op Pain Management:    Induction:   PONV Risk Score and Plan: Ondansetron   Airway Management Planned:   Additional Equipment:   Intra-op Plan:   Post-operative Plan:   Informed Consent: I have reviewed the patients History and Physical, chart, labs and discussed the procedure including the risks, benefits and alternatives for the proposed anesthesia with the patient or authorized representative who has indicated his/her understanding and acceptance.     Dental Advisory Given  Plan Discussed with: CRNA  Anesthesia Plan Comments:        Anesthesia Quick Evaluation

## 2023-11-19 NOTE — Op Note (Signed)
 Patient:  Aaron Chambers  DOB:  1981-02-21  MRN:  [id number]   Preop Diagnosis: Umbilical hernia  Postop Diagnosis: Same  Procedure: Robotic assisted laparoscopic umbilical herniorrhaphy with mesh  Surgeon: Alanda Allegra, MD  Anes: General Endotracheal  Indications: Patient is a 43 year old white male who presents with a symptomatic umbilical hernia.  The risks and benefits of the procedure including bleeding, infection, mesh use, and the possibility of recurrence of the hernia were fully explained to the patient, who gave informed consent.  Procedure note: The patient was placed in the supine position.  After induction of general endotracheal anesthesia, the abdomen was prepped and draped using the usual sterile technique with ChloraPrep.  Surgical site confirmation was performed.  An incision was made in the left upper quadrant at Palmer's point.  A Veress needle was introduced into the abdominal cavity and confirmation of placement was done using the saline drop test.  The abdomen was then insufflated to 15 mmHg pressure.  An 8 mm trocar was introduced into the abdominal cavity under direct visualization without difficulty.  Additional 8 mm trocars were placed in the left flank and left lower quadrant regions.  The robot was then docked and targeted.  The patient was noted to have a greater than 3 cm umbilical hernia.  There was some incarcerated omentum but this was reduced from the underside of the umbilicus without difficulty.  I could not resect the hernia sac as the skin overlying it was very thin.  The hernia defect was closed longitudinally using an 0 Stratafix running suture.  An 11 cm round dual Bard mesh was then inserted and secured circumferentially using 3-0 Stratafix running suture.  The robot was undocked and all air was evacuated from the abdominal cavity prior to removal of the trocars.  All wounds were irrigated with normal saline.  All wounds were injected with 0.5%  Sensorcaine.  All incisions were closed using a 4-0 Monocryl subcuticular suture.  Dermabond was applied.  All tape and needle counts were correct at the end of the procedure.  The patient was extubated in the operating room and transferred to PACU in stable condition.  Complications: None  EBL: Minimal  Specimen: None

## 2023-11-20 ENCOUNTER — Ambulatory Visit (INDEPENDENT_AMBULATORY_CARE_PROVIDER_SITE_OTHER): Payer: Self-pay

## 2023-11-22 NOTE — Anesthesia Postprocedure Evaluation (Signed)
 Anesthesia Post Note  Patient: Aaron Chambers  Procedure(s) Performed: REPAIR, HERNIA, UMBILICAL, ROBOT-ASSISTED (Abdomen)  Patient location during evaluation: Phase II Anesthesia Type: General Level of consciousness: awake Pain management: pain level controlled Vital Signs Assessment: post-procedure vital signs reviewed and stable Respiratory status: spontaneous breathing and respiratory function stable Cardiovascular status: blood pressure returned to baseline and stable Postop Assessment: no headache and no apparent nausea or vomiting Anesthetic complications: no Comments: Late entry   No notable events documented.   Last Vitals:  Vitals:   11/19/23 1129 11/19/23 1130  BP: 132/82 130/80  Pulse: 74 78  Resp:  13  Temp: 36.4 C   SpO2: 92% 90%    Last Pain:  Vitals:   11/19/23 1129  TempSrc: Oral  PainSc: 4                  Coretha Dew

## 2023-11-29 ENCOUNTER — Ambulatory Visit: Admitting: General Surgery

## 2023-11-29 ENCOUNTER — Encounter: Payer: Self-pay | Admitting: General Surgery

## 2023-11-29 VITALS — BP 132/91 | HR 73 | Temp 97.8°F | Resp 18 | Ht 76.0 in | Wt 335.0 lb

## 2023-11-29 DIAGNOSIS — Z8719 Personal history of other diseases of the digestive system: Secondary | ICD-10-CM

## 2023-11-29 DIAGNOSIS — Z9889 Other specified postprocedural states: Secondary | ICD-10-CM

## 2023-11-29 NOTE — Progress Notes (Signed)
 Subjective:     Aaron Chambers  Patient here for postoperative visit, status post robotic assisted laparoscopic umbilical herniorrhaphy with mesh.  He states he is doing well.  He has minimal incisional pain.  He has been taking ibuprofen for pain.  Patient is pleased with results. Objective:    BP (!) 132/91   Pulse 73   Temp 97.8 F (36.6 C) (Oral)   Resp 18   Ht 6\' 4"  (1.93 m)   Wt (!) 335 lb (152 kg)   SpO2 98%   BMI 40.78 kg/m   General:  alert, cooperative, and no distress  Abdomen is soft, incisions healing well.  No seroma or hematoma present.     Assessment:    Doing well postoperatively.    Plan:   May increase activity as able.  Follow-up here as needed.

## 2023-12-11 ENCOUNTER — Encounter (INDEPENDENT_AMBULATORY_CARE_PROVIDER_SITE_OTHER): Payer: Self-pay

## 2024-01-14 NOTE — Telephone Encounter (Signed)
 Patient called today saying he had received the medication for free for a month and he was having difficulties with get a refill. I have called [facility] at [phone] spoke with Revianya S there and she says they are in need of the Appeal to be submitted to the patient insurance in order to place the patient on the Extended bridge program (free medication). The patient never submitted the consent to send the appeal. I did reach out to the patient numerous times and mailed the consent to his home and patient never responded to the calls, My Chart messages, or the mailed consent form back to the office.

## 2024-02-14 ENCOUNTER — Telehealth: Payer: Self-pay | Admitting: Nurse Practitioner

## 2024-02-14 DIAGNOSIS — E041 Nontoxic single thyroid nodule: Secondary | ICD-10-CM

## 2024-02-14 DIAGNOSIS — E049 Nontoxic goiter, unspecified: Secondary | ICD-10-CM

## 2024-02-14 NOTE — Telephone Encounter (Signed)
 Sent pt my chart message and mailed lab order.

## 2024-02-14 NOTE — Telephone Encounter (Signed)
 Pt has called back and wanting to know what he needs to do for an appointment.  Does he need labs and if so can you put the order in?

## 2024-02-14 NOTE — Telephone Encounter (Signed)
 He will need some labs, and it is also time for us  to repeat a thyroid  ultrasound to assess the nodules he had (unless this was already done at [facility]).  I will put in the order for both labs and U/S.

## 2024-03-27 ENCOUNTER — Ambulatory Visit (HOSPITAL_COMMUNITY)
Admission: RE | Admit: 2024-03-27 | Discharge: 2024-03-27 | Disposition: A | Discharge status: Home / Self Care | Source: Ambulatory Visit | Attending: Nurse Practitioner | Admitting: Nurse Practitioner

## 2024-03-27 DIAGNOSIS — E049 Nontoxic goiter, unspecified: Secondary | ICD-10-CM | POA: Diagnosis present

## 2024-03-27 DIAGNOSIS — E041 Nontoxic single thyroid nodule: Secondary | ICD-10-CM | POA: Insufficient documentation

## 2024-04-11 ENCOUNTER — Other Ambulatory Visit (HOSPITAL_COMMUNITY): Payer: Self-pay

## 2024-04-23 ENCOUNTER — Encounter (INDEPENDENT_AMBULATORY_CARE_PROVIDER_SITE_OTHER): Payer: Self-pay | Admitting: Gastroenterology

## 2024-05-03 LAB — T3, FREE: T3, Free: 3.3 pg/mL (ref 2.0–4.4)

## 2024-05-03 LAB — TSH: TSH: 0.693 u[IU]/mL (ref 0.450–4.500)

## 2024-05-03 LAB — T4, FREE: Free T4: 0.99 ng/dL (ref 0.82–1.77)

## 2024-05-09 ENCOUNTER — Encounter: Payer: Self-pay | Admitting: Nurse Practitioner

## 2024-05-09 ENCOUNTER — Ambulatory Visit: Admitting: Nurse Practitioner

## 2024-05-09 VITALS — BP 118/78 | HR 64 | Ht 76.0 in | Wt 321.6 lb

## 2024-05-09 DIAGNOSIS — E049 Nontoxic goiter, unspecified: Secondary | ICD-10-CM

## 2024-05-09 DIAGNOSIS — E041 Nontoxic single thyroid nodule: Secondary | ICD-10-CM | POA: Diagnosis not present

## 2024-05-09 NOTE — Progress Notes (Signed)
 Endocrinology Follow Up Note 05/09/24    ---------------------------------------------------------------------------------------------------------------------- Subjective    Past Medical History:  Diagnosis Date   Allergic rhinitis    Anxiety    Arrhythmia    Asthma    Diabetes mellitus without complication (HCC)    History of kidney stones    Obesity     Past Surgical History:  Procedure Laterality Date   CARDIAC ELECTROPHYSIOLOGY STUDY AND ABLATION     VASCULAR SURGERY     varicose veins    Social History   Socioeconomic History   Marital status: Married    Spouse name: Not on file   Number of children: Not on file   Years of education: Not on file   Highest education level: Not on file  Occupational History   Not on file  Tobacco Use   Smoking status: Never   Smokeless tobacco: Current    Types: Snuff  Vaping Use   Vaping status: Never Used  Substance and Sexual Activity   Alcohol use: No   Drug use: No   Sexual activity: Not on file  Other Topics Concern   Not on file  Social History Narrative   Not on file   Social Drivers of Health   Financial Resource Strain: Not on file  Food Insecurity: Not on file  Transportation Needs: Not on file  Physical Activity: Not on file  Stress: Not on file  Social Connections: Not on file  Intimate Partner Violence: Not on file    Current Outpatient Medications on File Prior to Visit  Medication Sig Dispense Refill   blood glucose meter kit and supplies Dispense based on patient and insurance preference. Use to check glucose once daily (FOR ICD-10  E11.9). 1 each 5   buPROPion  (WELLBUTRIN ) 100 MG tablet Take 200 mg by mouth every morning.     clonazePAM  (KLONOPIN ) 1 MG tablet Take 1 mg by mouth 2 (two) times daily as needed for anxiety.     glucose blood test strip Test glucose 1x per day. 100 each 3   ibuprofen (ADVIL) 200 MG tablet Take 400 mg by mouth daily as needed for headache.     lamoTRIgine  (LAMICTAL )  100 MG tablet Take 100 mg by mouth daily.     metFORMIN  (GLUCOPHAGE ) 500 MG tablet TAKE 2 TABLETS BY MOUTH TWICE DAILY. 120 tablet 0   Multiple Vitamin (MULTIVITAMIN WITH MINERALS) TABS tablet Take 1 tablet by mouth daily.     Resmetirom  (REZDIFFRA ) 100 MG TABS Take 100 mg by mouth daily at 6 (six) AM. 30 tablet 11   rosuvastatin (CRESTOR) 10 MG tablet Take 10 mg by mouth at bedtime.     tirzepatide (MOUNJARO) 15 MG/0.5ML Pen Inject 15 mg into the skin once a week.     venlafaxine XR (EFFEXOR-XR) 37.5 MG 24 hr capsule Take 37.5 mg by mouth daily.     VITAMIN D PO Take 1 capsule by mouth daily.     No current facility-administered medications on file prior to visit.      HPI   OTHO MICHALIK is a 43 y.o.-year-old male, referred by his PCP, Dr.Golding, for evaluation for multinodular goiter.  His PCP was worried that his use of Ozempic could have contributed to his goiter.  He has been on Ozempic for over 1 year and has had great success managing his diabetes with it.  Thyroid  U/S: 06/16/22  No prior examinations available for comparison.  Right lobe: Right lobe of the thyroid  gland measures 6.7  x 1.8 x 2.6 cm in overall dimension. Right lobe nodules: No discrete nodules seen.  Left lobe: Left lobe of the gland measures 7.0 x 2.3 x 2.0 cm in dimension. Left lobe nodules: solitary nodule left lobe Nodule 1: small hypoechoic cystic nodule midportion left lobe measuring 0.3 x 0.2 x 0.3 cm in dimension TR 4  Isthmus: Isthmus measures 0.63 cm.  No discrete nodule seen  Texture:  Texture is homogeneous.  No calcification or shadowing. Blood flow: Normal vascularity seen.     I reviewed pt's thyroid  tests: Lab Results  Component Value Date   TSH 0.693 05/02/2024   TSH 1.410 08/11/2022   TSH 1.490 02/25/2019   TSH 1.240 11/03/2015   TSH 1.560 03/23/2014   FREET4 0.99 05/02/2024   FREET4 1.21 08/11/2022     Pt c/o: - nerve pain in fingers and toes (like electrical shock) -  anxiety (managed well on current medications)  Pt denies - feeling nodules in neck - hoarseness - dysphagia - choking - SOB with lying down  No FH of thyroid  ds. No FH of thyroid  cancer. No h/o radiation tx to head or neck.  No seaweed or kelp. No recent contrast studies. No steroid use. No herbal supplements. He does take a daily MVI which has Biotin in it.  Pt also has a history of anxiety, tobacco abuse (dips), diabetes, HLD, fatty liver disease.  Review of systems  Constitutional: + steadily decreasing body weight (currently on Ozempic for DM),  current Body mass index is 39.15 kg/m. , no fatigue, no subjective hyperthermia, no subjective hypothermia Eyes: no blurry vision, no xerophthalmia ENT: no sore throat, no nodules palpated in throat, no dysphagia/odynophagia, no hoarseness Cardiovascular: no chest pain, no shortness of breath, no palpitations, no leg swelling Respiratory: no cough, no shortness of breath Gastrointestinal: no nausea/vomiting/diarrhea Musculoskeletal: no muscle/joint aches Skin: no rashes, no hyperemia Neurological: no tremors, + numbness/tingling/nerve pain to bilateral hands and feet, no dizziness Psychiatric: no depression, + anxiety- currently managed well with meds  ---------------------------------------------------------------------------------------------------------------------- Objective    BP 118/78 (BP Location: Left Arm, Patient Position: Sitting, Cuff Size: Large)   Pulse 64   Ht 6' 4 (1.93 m)   Wt (!) 321 lb 9.6 oz (145.9 kg)   BMI 39.15 kg/m    BP Readings from Last 3 Encounters:  05/09/24 118/78  11/29/23 (!) 132/91  11/19/23 130/80    Wt Readings from Last 3 Encounters:  05/09/24 (!) 321 lb 9.6 oz (145.9 kg)  11/29/23 (!) 335 lb (152 kg)  11/16/23 (!) 350 lb (158.8 kg)     Physical Exam- Limited  Constitutional:  Body mass index is 39.15 kg/m. , not in acute distress, normal state of mind Eyes:  EOMI, no  exophthalmos Neck: fullness noted bilaterally Thyroid : + gross goiter, firm texture, nontender to palpation, no palpable nodularity Cardiovascular: RRR, no murmurs, rubs, or gallops, no edema Respiratory: Adequate breathing efforts, no crackles, rales, rhonchi, or wheezing Musculoskeletal: no gross deformities, strength intact in all four extremities, no gross restriction of joint movements Skin:  no rashes, no hyperemia Neurological: no tremor with outstretched hands      Latest Reference Range & Units 11/03/15 15:40 02/25/19 09:45 08/11/22 08:57 05/02/24 08:56  TSH 0.450 - 4.500 uIU/mL 1.240 1.490 1.410 0.693  Triiodothyronine,Free,Serum 2.0 - 4.4 pg/mL   3.2 3.3  T4,Free(Direct) 0.82 - 1.77 ng/dL   8.78 9.00  Thyroperoxidase Ab SerPl-aCnc 0 - 34 IU/mL   <9   Thyroglobulin Antibody 0.0 -  0.9 IU/mL   <1.0    Thyroid  ultrasound from 03/27/24 CLINICAL DATA:  Follow-up multinodular goiter   EXAM: THYROID  ULTRASOUND   TECHNIQUE: Ultrasound examination of the thyroid  gland and adjacent soft tissues was performed.   COMPARISON:  CT angiogram of the chest performed March 24, 2018   FINDINGS: Parenchymal Echotexture: Normal   Isthmus: 0.8 cm   Right lobe: 6.4 x 2.3 x 2.6 cm   Left lobe: 5.9 x 2.4 x 2.1 cm   _________________________________________________________   Estimated total number of nodules >/= 1 cm: 0   Number of spongiform nodules >/=  2 cm not described below (TR1): 0   Number of mixed cystic and solid nodules >/= 1.5 cm not described below (TR2): 0   _________________________________________________________   No suspicious nodules are seen within the thyroid  gland.   IMPRESSION: 1. No suspicious thyroid  nodule.   The above is in keeping with the ACR TI-RADS recommendations - J Am Coll Radiol 2017;14:587-595.     Electronically Signed   By: Maude Naegeli M.D.   On: 03/28/2024  09:12 ----------------------------------------------------------------------------------------------------------------------  ASSESSMENT / PLAN:  1. Thyroid  Nodule 2. Goiter  His recent thyroid  labs show euthyroid presentation.  He does not need any antithyroid treatment or thyroid  hormone replacement at this time.  His repeat ultrasound from 03/27/24 shows normal echotexture, slightly enlarged gland, with no nodularity.  He will not need any additional imaging studies unless something new develops.  He notes he may be interested in seeing me for DM management as well.  I did ask he discuss with his PCP and have them send over pertinent OV notes for review as well.   Follow Up Plan: Return if symptoms worsen or fail to improve.     I spent  41  minutes in the care of the patient today including review of labs from Thyroid  Function, CMP, and other relevant labs ; imaging/biopsy records (current and previous including abstractions from other facilities); face-to-face time discussing  his lab results and symptoms, medications doses, his options of short and long term treatment based on the latest standards of care / guidelines;   and documenting the encounter.  Donnice NOVAK Bilton  participated in the discussions, expressed understanding, and voiced agreement with the above plans.  All questions were answered to his satisfaction. he is encouraged to contact clinic should he have any questions or concerns prior to his return visit.    Benton Rio, [facility] [address] [place], [place] [address] Phone: [phone] Fax: [phone]

## 2024-05-31 ENCOUNTER — Encounter (HOSPITAL_COMMUNITY): Payer: Self-pay

## 2024-05-31 ENCOUNTER — Other Ambulatory Visit: Payer: Self-pay

## 2024-05-31 ENCOUNTER — Emergency Department (HOSPITAL_COMMUNITY)

## 2024-05-31 ENCOUNTER — Emergency Department (HOSPITAL_COMMUNITY)
Admission: EM | Admit: 2024-05-31 | Discharge: 2024-05-31 | Disposition: A | Discharge status: Home / Self Care | Attending: Emergency Medicine | Admitting: Emergency Medicine

## 2024-05-31 DIAGNOSIS — J45909 Unspecified asthma, uncomplicated: Secondary | ICD-10-CM | POA: Insufficient documentation

## 2024-05-31 DIAGNOSIS — R10A0 Flank pain, unspecified side: Secondary | ICD-10-CM | POA: Diagnosis present

## 2024-05-31 DIAGNOSIS — E119 Type 2 diabetes mellitus without complications: Secondary | ICD-10-CM | POA: Insufficient documentation

## 2024-05-31 DIAGNOSIS — N132 Hydronephrosis with renal and ureteral calculous obstruction: Secondary | ICD-10-CM | POA: Diagnosis not present

## 2024-05-31 DIAGNOSIS — N200 Calculus of kidney: Secondary | ICD-10-CM

## 2024-05-31 LAB — CBC WITH DIFFERENTIAL/PLATELET
Abs Immature Granulocytes: 0.03 K/uL (ref 0.00–0.07)
Basophils Absolute: 0 K/uL (ref 0.0–0.1)
Basophils Relative: 0 %
Eosinophils Absolute: 0.1 K/uL (ref 0.0–0.5)
Eosinophils Relative: 1 %
HCT: 41.6 % (ref 39.0–52.0)
Hemoglobin: 13.6 g/dL (ref 13.0–17.0)
Immature Granulocytes: 0 %
Lymphocytes Relative: 20 %
Lymphs Abs: 2.2 K/uL (ref 0.7–4.0)
MCH: 27.3 pg (ref 26.0–34.0)
MCHC: 32.7 g/dL (ref 30.0–36.0)
MCV: 83.4 fL (ref 80.0–100.0)
Monocytes Absolute: 1.3 K/uL — ABNORMAL HIGH (ref 0.1–1.0)
Monocytes Relative: 11 %
Neutro Abs: 7.6 K/uL (ref 1.7–7.7)
Neutrophils Relative %: 68 %
Platelets: 182 K/uL (ref 150–400)
RBC: 4.99 MIL/uL (ref 4.22–5.81)
RDW: 13.4 % (ref 11.5–15.5)
WBC: 11.3 K/uL — ABNORMAL HIGH (ref 4.0–10.5)
nRBC: 0 % (ref 0.0–0.2)

## 2024-05-31 LAB — COMPREHENSIVE METABOLIC PANEL WITH GFR
ALT: 45 U/L — ABNORMAL HIGH (ref 0–44)
AST: 38 U/L (ref 15–41)
Albumin: 4.5 g/dL (ref 3.5–5.0)
Alkaline Phosphatase: 107 U/L (ref 38–126)
Anion gap: 13 (ref 5–15)
BUN: 16 mg/dL (ref 6–20)
CO2: 22 mmol/L (ref 22–32)
Calcium: 9.5 mg/dL (ref 8.9–10.3)
Chloride: 105 mmol/L (ref 98–111)
Creatinine, Ser: 1.09 mg/dL (ref 0.61–1.24)
GFR, Estimated: >60 mL/min (ref >60)
Glucose, Bld: 117 mg/dL — ABNORMAL HIGH (ref 70–99)
Potassium: 3.9 mmol/L (ref 3.5–5.1)
Sodium: 140 mmol/L (ref 135–145)
Total Bilirubin: 0.8 mg/dL (ref 0.0–1.2)
Total Protein: 7.1 g/dL (ref 6.5–8.1)

## 2024-05-31 LAB — URINALYSIS, ROUTINE W REFLEX MICROSCOPIC
Bacteria, UA: NONE SEEN
Bilirubin Urine: NEGATIVE
Glucose, UA: NEGATIVE mg/dL
Ketones, ur: NEGATIVE mg/dL
Leukocytes,Ua: NEGATIVE
Nitrite: NEGATIVE
Protein, ur: NEGATIVE mg/dL
Specific Gravity, Urine: 1.018 (ref 1.005–1.030)
pH: 5 (ref 5.0–8.0)

## 2024-05-31 LAB — LIPASE, BLOOD: Lipase: 66 U/L — ABNORMAL HIGH (ref 11–51)

## 2024-05-31 MED ORDER — OXYCODONE HCL 5 MG PO TABS: 5.0000 mg | ORAL_TABLET | ORAL | 0 refills | Status: DC | PRN

## 2024-05-31 MED ORDER — KETOROLAC TROMETHAMINE 15 MG/ML IJ SOLN
15.0000 mg | Freq: Once | INTRAMUSCULAR | Status: AC
  Administered 2024-05-31: 15 mg via INTRAVENOUS
  Filled 2024-05-31: qty 1

## 2024-05-31 MED ORDER — MORPHINE SULFATE (PF) 4 MG/ML IV SOLN
4.0000 mg | Freq: Once | INTRAVENOUS | Status: AC
  Administered 2024-05-31: 4 mg via INTRAVENOUS
  Filled 2024-05-31: qty 1

## 2024-05-31 NOTE — ED Provider Notes (Signed)
 [facility] EMERGENCY DEPARTMENT AT [facility] Provider Note   CSN: [id number] Arrival date & time: 05/31/24  1211     Patient presents with: Flank Pain   Aaron Chambers is a 43 y.o. male with history of nephrolithiasis presents with complaints of flank pain since Thursday.  No injury or trauma.  Is associated with hematuria.  No dysuria or increased frequency.  States it feels just like a kidney stone.  Radiates to his lower abdomen.  Pain is constant.  Typically does not last as long.  Was evaluated by his PCP yesterday provided a dose of Toradol  and Flomax .  Symptoms have persisted however.  No radicular symptoms.    Flank Pain   Past Medical History:  Diagnosis Date   Allergic rhinitis    Anxiety    Arrhythmia    Asthma    Diabetes mellitus without complication (HCC)    History of kidney stones    Obesity    Past Surgical History:  Procedure Laterality Date   CARDIAC ELECTROPHYSIOLOGY STUDY AND ABLATION     VASCULAR SURGERY     varicose veins        Prior to Admission medications   Medication Sig Start Date End Date Taking? Authorizing Provider  blood glucose meter kit and supplies Dispense based on patient and insurance preference. Use to check glucose once daily (FOR ICD-10  E11.9). 03/12/19   Alphonsa Elsie RAMAN, MD  buPROPion  (WELLBUTRIN ) 100 MG tablet Take 200 mg by mouth every morning. 10/13/23   [provider]  clonazePAM  (KLONOPIN ) 1 MG tablet Take 1 mg by mouth 2 (two) times daily as needed for anxiety.    [provider]  glucose blood test strip Test glucose 1x per day. 04/13/20   Waddell, Malena M, DO  ibuprofen (ADVIL) 200 MG tablet Take 400 mg by mouth daily as needed for headache.    [provider]  lamoTRIgine  (LAMICTAL ) 100 MG tablet Take 100 mg by mouth daily.    [provider]  metFORMIN  (GLUCOPHAGE ) 500 MG tablet TAKE 2 TABLETS BY MOUTH TWICE DAILY. 09/14/20   Waddell Simone HERO, DO  Multiple Vitamin  (MULTIVITAMIN WITH MINERALS) TABS tablet Take 1 tablet by mouth daily.    [provider]  oxyCODONE  (ROXICODONE ) 5 MG immediate release tablet Take 1 tablet (5 mg total) by mouth every 4 (four) hours as needed. 05/31/24  Yes Aaron Lynwood DEL, PA-C  Resmetirom  (REZDIFFRA ) 100 MG TABS Take 100 mg by mouth daily at 6 (six) AM. 11/08/23   Eartha Flavors, Toribio, MD  rosuvastatin (CRESTOR) 10 MG tablet Take 10 mg by mouth at bedtime. 11/12/23   [provider]  tirzepatide CLOYDE) 15 MG/0.5ML Pen Inject 15 mg into the skin once a week.    [provider]  venlafaxine XR (EFFEXOR-XR) 37.5 MG 24 hr capsule Take 37.5 mg by mouth daily.    [provider]  VITAMIN D PO Take 1 capsule by mouth daily.    [provider]    Allergies: Patient has no known allergies.    Review of Systems  Genitourinary:  Positive for flank pain.    Updated Vital Signs BP 138/85 (BP Location: Right Arm)   Pulse 86   Temp 97.9 F (36.6 C) (Oral)   Resp 16   Wt (!) 142.9 kg   SpO2 98%   BMI 38.34 kg/m   Physical Exam Vitals and nursing note reviewed.  Constitutional:      General:  He is not in acute distress.    Appearance: He is well-developed.     Comments: Pacing around the room  HENT:     Head: Normocephalic and atraumatic.  Eyes:     Conjunctiva/sclera: Conjunctivae normal.  Cardiovascular:     Rate and Rhythm: Normal rate and regular rhythm.     Heart sounds: No murmur heard. Pulmonary:     Effort: Pulmonary effort is normal. No respiratory distress.     Breath sounds: Normal breath sounds.  Abdominal:     Palpations: Abdomen is soft.     Tenderness: There is no abdominal tenderness. There is no right CVA tenderness or left CVA tenderness.  Musculoskeletal:        General: No swelling.     Cervical back: Neck supple.     Comments: No midline spinal tenderness, 5 out of 5 lower extremity strength, ambulates without difficulty  Skin:    General:  Skin is warm and dry.     Capillary Refill: Capillary refill takes less than 2 seconds.  Neurological:     Mental Status: He is alert.  Psychiatric:        Mood and Affect: Mood normal.     (all labs ordered are listed, but only abnormal results are displayed) Labs Reviewed  CBC WITH DIFFERENTIAL/PLATELET - Abnormal; Notable for the following components:      Result Value   WBC 11.3 (*)    Monocytes Absolute 1.3 (*)    All other components within normal limits  COMPREHENSIVE METABOLIC PANEL WITH GFR - Abnormal; Notable for the following components:   Glucose, Bld 117 (*)    ALT 45 (*)    All other components within normal limits  LIPASE, BLOOD - Abnormal; Notable for the following components:   Lipase 66 (*)    All other components within normal limits  URINALYSIS, ROUTINE W REFLEX MICROSCOPIC - Abnormal; Notable for the following components:   Hgb urine dipstick SMALL (*)    All other components within normal limits    EKG: None  Radiology: CT Renal Stone Study Result Date: 05/31/2024 CLINICAL DATA:  Abdominal/flank pain.  Concern for kidney stone. EXAM: CT ABDOMEN AND PELVIS WITHOUT CONTRAST TECHNIQUE: Multidetector CT imaging of the abdomen and pelvis was performed following the standard protocol without IV contrast. RADIATION DOSE REDUCTION: This exam was performed according to the departmental dose-optimization program which includes automated exposure control, adjustment of the mA and/or kV according to patient size and/or use of iterative reconstruction technique. COMPARISON:  CT abdomen pelvis dated 11/23/2019. FINDINGS: Evaluation of this exam is limited in the absence of intravenous contrast. Lower chest: The visualized lung bases are clear. No intra-abdominal free air or free fluid. Hepatobiliary: The liver is unremarkable. No BD dilatation. The gallbladder unremarkable. Pancreas: Unremarkable. No pancreatic ductal dilatation or surrounding inflammatory changes. Spleen:  Normal in size without focal abnormality. Adrenals/Urinary Tract: The adrenal glands are unremarkable. Multiple nonobstructing bilateral renal calculi measure up to 8 mm in the upper pole of the left kidney. There is a 10 x 12 mm stone in the distal right ureter with mild right hydronephrosis. No hydronephrosis or obstructing stone on the left. The left ureter and urinary bladder appear unremarkable. Stomach/Bowel: There is no bowel obstruction or active inflammation. The appendix is normal. Vascular/Lymphatic: The abdominal aorta and IVC are grossly unremarkable on this noncontrast CT. No portal venous gas. There is no adenopathy. Reproductive: The prostate and seminal vesicles are grossly unremarkable. Other: None Musculoskeletal: Osteopenia  with degenerative changes of spine. No acute osseous pathology. IMPRESSION: 1. A 10 x 12 mm stone in the distal right ureter with mild right hydronephrosis. 2. Multiple nonobstructing bilateral renal calculi. 3. No bowel obstruction. Normal appendix. Electronically Signed   By: Vanetta Chou M.D.   On: 05/31/2024 12:58     Procedures   Medications Ordered in the ED  ketorolac  (TORADOL ) 15 MG/ML injection 15 mg (15 mg Intravenous Given 05/31/24 1301)  morphine  (PF) 4 MG/ML injection 4 mg (4 mg Intravenous Given 05/31/24 1326)    Clinical Course as of 05/31/24 1551  Sat May 31, 2024  1235 Patient with history of kidney stones evaluated for flank pain that radiates to his right lower abdomen.  Is associated with some hematuria.  Upon arrival patient is hemodynamically stable.  He appears uncomfortable pacing around the room.  His abdomen is nontender.  He has no CVAT.  No midline spinal tenderness.  No red flag symptoms.  Will obtain routine labs and CT renal study. [JT]  1308 CT Renal Stone Study 10 x 12 mm stone in the distal right ureter with mild right hydronephrosis, multiple nonobstructing bilateral renal calculi as well [JT]  1318 CBC with  Differential(!) Leukocytosis of 11.3 [JT]  1331 Comprehensive metabolic panel(!) No significant finding [JT]  1331 Lipase, blood(!) Lipase of 66 [JT]  1331 Urinalysis, Routine w reflex microscopic -Urine, Clean Catch(!) Negative nitrates, negative leukocytes, hemoglobin present [JT]  1332 Urology consulted [JT]  1538 Urology, Dr. Sherrilee recommended outpatient follow-up as pain is controlled at this point..  Will contact office on Monday.  Plan for likely lithotripsy on Tuesday.  Will send in short supply of oxycodone .  Patient has p.o. Toradol  and Flomax  at home. [JT]    Clinical Course User Index [JT] Aaron Lynwood DEL, PA-C                                 Medical Decision Making Amount and/or Complexity of Data Reviewed Labs: ordered. Decision-making details documented in ED Course. Radiology: ordered. Decision-making details documented in ED Course.  Risk Prescription drug management.   This patient presents to the ED with chief complaint(s) of flank pain.  The complaint involves an extensive differential diagnosis and also carries with it a high risk of complications and morbidity.   Pertinent past medical history as listed in HPI  The differential diagnosis includes  Nephrolithiasis, pyelonephritis, cystitis, musculoskeletal Additional history obtained: Records reviewed Care Everywhere/External Records  Disposition:   Patient will be discharged home. The patient has been appropriately medically screened and/or stabilized in the ED. I have low suspicion for any other emergent medical condition which would require further screening, evaluation or treatment in the ED or require inpatient management. At time of discharge the patient is hemodynamically stable and in no acute distress. I have discussed work-up results and diagnosis with patient and answered all questions. Patient is agreeable with discharge plan. We discussed strict return precautions for returning to the emergency  department and they verbalized understanding.     Social Determinants of Health:   none  This note was dictated with voice recognition software.  Despite best efforts at proofreading, errors may have occurred which can change the documentation meaning.       Final diagnoses:  Kidney stone    ED Discharge Orders          Ordered    oxyCODONE  (ROXICODONE ) 5 MG  immediate release tablet  Every 4 hours PRN        05/31/24 1551               Aaron Lynwood Aaron Chambers 05/31/24 1551    Cleotilde Rogue, MD 06/01/24 [phone]

## 2024-05-31 NOTE — Discharge Instructions (Signed)
 You were evaluated in the emergency room for flank pain.  You are found to have a large 10 x 12 mm stone that is currently passing.  Please call the urology office on Monday morning for close follow-up.  Prescription for oxycodone  has been sent to your pharmacy.  I would additionally recommend alternating the Tylenol  and Toradol  that you have at home as well.

## 2024-05-31 NOTE — ED Triage Notes (Addendum)
 Pt reports right side flank pain since Thursday with hx of kidney stones.  Pt saw PCP yesterday and received a shot of toradol  and flomax .

## 2024-06-02 ENCOUNTER — Encounter (HOSPITAL_COMMUNITY): Payer: Self-pay

## 2024-06-02 ENCOUNTER — Other Ambulatory Visit: Payer: Self-pay

## 2024-06-02 ENCOUNTER — Ambulatory Visit (HOSPITAL_COMMUNITY)
Admission: RE | Admit: 2024-06-02 | Discharge: 2024-06-02 | Disposition: A | Discharge status: Home / Self Care | Source: Ambulatory Visit | Attending: Urology | Admitting: Urology

## 2024-06-02 ENCOUNTER — Ambulatory Visit (HOSPITAL_COMMUNITY)
Admission: RE | Admit: 2024-06-02 | Discharge: 2024-06-02 | Disposition: A | Source: Ambulatory Visit | Attending: Urology

## 2024-06-02 ENCOUNTER — Ambulatory Visit: Admitting: Urology

## 2024-06-02 ENCOUNTER — Encounter: Payer: Self-pay | Admitting: Urology

## 2024-06-02 VITALS — BP 127/85 | HR 108

## 2024-06-02 DIAGNOSIS — N200 Calculus of kidney: Secondary | ICD-10-CM | POA: Diagnosis not present

## 2024-06-02 LAB — URINALYSIS, ROUTINE W REFLEX MICROSCOPIC
Bilirubin, UA: NEGATIVE
Glucose, UA: NEGATIVE
Leukocytes,UA: NEGATIVE
Nitrite, UA: NEGATIVE
Protein,UA: NEGATIVE
Specific Gravity, UA: 1.03 (ref 1.005–1.030)
Urobilinogen, Ur: 1 mg/dL (ref 0.2–1.0)
pH, UA: 6 (ref 5.0–7.5)

## 2024-06-02 LAB — MICROSCOPIC EXAMINATION
Bacteria, UA: NONE SEEN
WBC, UA: NONE SEEN /HPF (ref 0–5)

## 2024-06-02 MED ORDER — ONDANSETRON HCL 4 MG PO TABS: 4.0000 mg | ORAL_TABLET | Freq: Every day | ORAL | 1 refills | Status: DC | PRN

## 2024-06-02 MED ORDER — OXYCODONE HCL 5 MG PO TABS: 5.0000 mg | ORAL_TABLET | ORAL | 0 refills | Status: DC | PRN

## 2024-06-02 MED ORDER — TAMSULOSIN HCL 0.4 MG PO CAPS: 0.4000 mg | ORAL_CAPSULE | Freq: Every day | ORAL | 1 refills | Status: DC

## 2024-06-02 NOTE — Patient Instructions (Signed)
 ESWL for Kidney Stones  Extracorporeal shock wave lithotripsy (ESWL) is a treatment that can help break up kidney stones that are too large to pass on their own.  This is a nonsurgical procedure that breaks up a kidney stone with shock waves. These shock waves pass through your body and focus on the kidney stone. They cause the kidney stone to break into smaller pieces (fragments) while it is still in the urinary tract. The fragments of stone can pass more easily out of your body in the pee (urine). Tell a health care provider about: Any allergies you have. All medicines you are taking, including vitamins, herbs, eye drops, creams, and over-the-counter medicines. Any problems you or family members have had with anesthetic medicines. Any bleeding problems you have. Any surgeries you've had. Any medical conditions you have. Whether you're pregnant or may be pregnant. What are the risks? Your health care provider will talk with you about risks. These may include: Infection. Bleeding from the kidney. Bruising of the kidney or skin. Scarring of the kidney. This can lead to: Increased blood pressure. Poor kidney function. Return (recurrence) of kidney stones. Damage to other structures or organs. This may include the liver, colon, spleen, or pancreas. Blockage (obstruction) of the tube that carries pee from the kidney to the bladder (ureter). Failure of the kidney stone to break into fragments. What happens before the procedure? When to stop eating and drinking Follow instructions from your health care provider about what you may eat and drink. These may include: 8 hours before your procedure Stop eating most foods. Do not eat meat, fried foods, or fatty foods. Eat only light foods, such as toast or crackers. All liquids are okay except energy drinks and alcohol. 6 hours before your procedure Stop eating. Drink only clear liquids, such as water, clear fruit juice, black coffee, plain tea,  and sports drinks. Do not drink energy drinks or alcohol. 2 hours before your procedure Stop drinking all liquids. You may be allowed to take medicines with small sips of water. If you do not follow your health care provider's instructions, your procedure may be delayed or canceled. Medicines Ask your health care provider about: Changing or stopping your regular medicines. These include any diabetes medicines or blood thinners you take. Taking medicines such as aspirin and ibuprofen. These medicines can thin your blood. Do not take them unless your health care provider tells you to. Taking over-the-counter medicines, vitamins, herbs, and supplements. Tests You may have tests, such as: Blood tests. Pee (urine) tests. Imaging tests. This may include a CT scan. Surgery safety Ask your health care provider: How your surgery site will be marked. What steps will be taken to help prevent infection. These steps may include: Washing skin with a soap that kills germs. Receiving antibiotics. General instructions If you will be going home right after the procedure, plan to have a responsible adult: Take you home from the hospital or clinic. You will not be allowed to drive. Care for you for the time you are told. What happens during the procedure?  An IV will be inserted into one of your veins. You may be given: A sedative. This helps you relax. Anesthesia. This will: Numb certain areas of your body. Make you fall asleep for surgery. A water-filled cushion may be placed behind your kidney or on your abdomen. In some cases, you may be placed in a tub of lukewarm water. Your body will be positioned in a way that makes it  easier to target the kidney stone. An X-ray or ultrasound exam will be done to locate your stone. Shock waves will be aimed at the stone. If you are awake, you may feel a tapping sensation as the shock waves pass through your body. A small mesh tube (stent) may be placed in  your ureter. This will help keep pee flowing from the kidney if the fragments of the stone have been blocking the ureter. The stent will be removed at a later time by your health care provider. The procedure may vary among health care providers and hospitals. What happens after the procedure? Your blood pressure, heart rate, breathing rate, and blood oxygen level will be monitored until you leave the hospital or clinic. You may have an X-ray after the procedure to see how many of the kidney stones were broken up. This will also show how much of the stone has passed. If there are still large fragments after treatment, you may need to have a second procedure at a later time. This information is not intended to replace advice given to you by your health care provider. Make sure you discuss any questions you have with your health care provider. Document Revised: 01/06/2023 Document Reviewed: 10/27/2021 Elsevier Patient Education  2024 [employer].

## 2024-06-02 NOTE — H&P (View-Only) (Signed)
 06/02/2024 10:53 AM   Aaron Chambers 03, 1982 [id number]  Referring provider: Donnajean Lynwood DEL, PA-C [address] [place],  [place] [address]  Nephrolithiasis   HPI: Aaron Chambers is a 42yo here for evaluation of right ureteral calculus. Starting 4 days ago he developed constant, sharp, moderate rigth flank pain. His first stone event was 2009. He passes 1-2 stones per year. He presented to the ER 11/22 and was diagnosed with a 1.2cm right mid ureteral calculus. KUB from today shows right 1.2cm over the pelvis. He has nausea   PMH: Past Medical History:  Diagnosis Date   Allergic rhinitis    Anxiety    Arrhythmia    Asthma    Diabetes mellitus without complication (HCC)    History of kidney stones    Obesity     Surgical History: Past Surgical History:  Procedure Laterality Date   CARDIAC ELECTROPHYSIOLOGY STUDY AND ABLATION     VASCULAR SURGERY     varicose veins    Home Medications:  Allergies as of 06/02/2024   No Known Allergies      Medication List        Accurate as of June 02, 2024 10:53 AM. If you have any questions, ask your nurse or doctor.          blood glucose meter kit and supplies Dispense based on patient and insurance preference. Use to check glucose once daily (FOR ICD-10  E11.9).   buPROPion  100 MG tablet Commonly known as: WELLBUTRIN  Take 200 mg by mouth every morning.   clonazePAM  1 MG tablet Commonly known as: KLONOPIN  Take 1 mg by mouth 2 (two) times daily as needed for anxiety.   glucose blood test strip Test glucose 1x per day.   ibuprofen 200 MG tablet Commonly known as: ADVIL Take 400 mg by mouth daily as needed for headache.   lamoTRIgine  100 MG tablet Commonly known as: LAMICTAL  Take 100 mg by mouth daily.   metFORMIN  500 MG tablet Commonly known as: GLUCOPHAGE  TAKE 2 TABLETS BY MOUTH TWICE DAILY.   Mounjaro 15 MG/0.5ML Pen Generic drug: tirzepatide Inject 15 mg into the skin once a week.    multivitamin with minerals Tabs tablet Take 1 tablet by mouth daily.   oxyCODONE  5 MG immediate release tablet Commonly known as: Roxicodone  Take 1 tablet (5 mg total) by mouth every 4 (four) hours as needed.   Rezdiffra  100 MG Tabs Generic drug: Resmetirom  Take 100 mg by mouth daily at 6 (six) AM.   rosuvastatin 10 MG tablet Commonly known as: CRESTOR Take 10 mg by mouth at bedtime.   venlafaxine XR 37.5 MG 24 hr capsule Commonly known as: EFFEXOR-XR Take 37.5 mg by mouth daily.   VITAMIN D PO Take 1 capsule by mouth daily.        Allergies: No Known Allergies  Family History: No family history on file.  Social History:  reports that he has never smoked. His smokeless tobacco use includes snuff. He reports that he does not drink alcohol and does not use drugs.  ROS: All other review of systems were reviewed and are negative except what is noted above in HPI  Physical Exam: BP 127/85   Pulse (!) 108   Constitutional:  Alert and oriented, No acute distress. HEENT: [place] AT, moist mucus membranes.  Trachea midline, no masses. Cardiovascular: No clubbing, cyanosis, or edema. Respiratory: Normal respiratory effort, no increased work of breathing. GI: Abdomen is soft, nontender, nondistended, no abdominal masses  GU: No CVA tenderness.  Lymph: No cervical or inguinal lymphadenopathy. Skin: No rashes, bruises or suspicious lesions. Neurologic: Grossly intact, no focal deficits, moving all 4 extremities. Psychiatric: Normal mood and affect.  Laboratory Data: Lab Results  Component Value Date   WBC 11.3 (H) 05/31/2024   HGB 13.6 05/31/2024   HCT 41.6 05/31/2024   MCV 83.4 05/31/2024   PLT 182 05/31/2024    Lab Results  Component Value Date   CREATININE 1.09 05/31/2024    No results found for: PSA  No results found for: TESTOSTERONE  Lab Results  Component Value Date   HGBA1C 6.1 (H) 11/16/2023    Urinalysis    Component Value Date/Time    COLORURINE YELLOW 05/31/2024 1236   APPEARANCEUR CLEAR 05/31/2024 1236   LABSPEC 1.018 05/31/2024 1236   PHURINE 5.0 05/31/2024 1236   GLUCOSEU NEGATIVE 05/31/2024 1236   HGBUR SMALL (A) 05/31/2024 1236   BILIRUBINUR NEGATIVE 05/31/2024 1236   KETONESUR NEGATIVE 05/31/2024 1236   PROTEINUR NEGATIVE 05/31/2024 1236   NITRITE NEGATIVE 05/31/2024 1236   LEUKOCYTESUR NEGATIVE 05/31/2024 1236    Lab Results  Component Value Date   BACTERIA NONE SEEN 05/31/2024    Pertinent Imaging: Ct 05/31/2024 and KUB today: Images reviewed and discussed with the patient. No results found for this or any previous visit.  No results found for this or any previous visit.  No results found for this or any previous visit.  No results found for this or any previous visit.  No results found for this or any previous visit.  No results found for this or any previous visit.  No results found for this or any previous visit.  Results for orders placed during the hospital encounter of 05/31/24  CT Renal Stone Study  Narrative CLINICAL DATA:  Abdominal/flank pain.  Concern for kidney stone.  EXAM: CT ABDOMEN AND PELVIS WITHOUT CONTRAST  TECHNIQUE: Multidetector CT imaging of the abdomen and pelvis was performed following the standard protocol without IV contrast.  RADIATION DOSE REDUCTION: This exam was performed according to the departmental dose-optimization program which includes automated exposure control, adjustment of the mA and/or kV according to patient size and/or use of iterative reconstruction technique.  COMPARISON:  CT abdomen pelvis dated 11/23/2019.  FINDINGS: Evaluation of this exam is limited in the absence of intravenous contrast.  Lower chest: The visualized lung bases are clear.  No intra-abdominal free air or free fluid.  Hepatobiliary: The liver is unremarkable. No BD dilatation. The gallbladder unremarkable.  Pancreas: Unremarkable. No pancreatic ductal  dilatation or surrounding inflammatory changes.  Spleen: Normal in size without focal abnormality.  Adrenals/Urinary Tract: The adrenal glands are unremarkable. Multiple nonobstructing bilateral renal calculi measure up to 8 mm in the upper pole of the left kidney. There is a 10 x 12 mm stone in the distal right ureter with mild right hydronephrosis. No hydronephrosis or obstructing stone on the left. The left ureter and urinary bladder appear unremarkable.  Stomach/Bowel: There is no bowel obstruction or active inflammation. The appendix is normal.  Vascular/Lymphatic: The abdominal aorta and IVC are grossly unremarkable on this noncontrast CT. No portal venous gas. There is no adenopathy.  Reproductive: The prostate and seminal vesicles are grossly unremarkable.  Other: None  Musculoskeletal: Osteopenia with degenerative changes of spine. No acute osseous pathology.  IMPRESSION: 1. A 10 x 12 mm stone in the distal right ureter with mild right hydronephrosis. 2. Multiple nonobstructing bilateral renal calculi. 3. No bowel obstruction. Normal appendix.  Electronically Signed By: Vanetta Chou M.D. On: 05/31/2024 12:58   Assessment & Plan:    1. Kidney stone (Primary) -We discussed the management of kidney stones. These options include observation, ureteroscopy, shockwave lithotripsy (ESWL) and percutaneous nephrolithotomy (PCNL). We discussed which options are relevant to the patient's stone(s). We discussed the natural history of kidney stones as well as the complications of untreated stones and the impact on quality of life without treatment as well as with each of the above listed treatments. We also discussed the efficacy of each treatment in its ability to clear the stone burden. With any of these management options I discussed the signs and symptoms of infection and the need for emergent treatment should these be experienced. For each option we discussed the  ability of each procedure to clear the patient of their stone burden.   For observation I described the risks which include but are not limited to silent renal damage, life-threatening infection, need for emergent surgery, failure to pass stone and pain.   For ureteroscopy I described the risks which include bleeding, infection, damage to contiguous structures, positioning injury, ureteral stricture, ureteral avulsion, ureteral injury, need for prolonged ureteral stent, inability to perform ureteroscopy, need for an interval procedure, inability to clear stone burden, stent discomfort/pain, heart attack, stroke, pulmonary embolus and the inherent risks with general anesthesia.   For shockwave lithotripsy I described the risks which include arrhythmia, kidney contusion, kidney hemorrhage, need for transfusion, pain, inability to adequately break up stone, inability to pass stone fragments, Steinstrasse, infection associated with obstructing stones, need for alternate surgical procedure, need for repeat shockwave lithotripsy, MI, CVA, PE and the inherent risks with anesthesia/conscious sedation.   For PCNL I described the risks including positioning injury, pneumothorax, hydrothorax, need for chest tube, inability to clear stone burden, renal laceration, arterial venous fistula or malformation, need for embolization of kidney, loss of kidney or renal function, need for repeat procedure, need for prolonged nephrostomy tube, ureteral avulsion, MI, CVA, PE and the inherent risks of general anesthesia.   - The patient would like to proceed with right ESWL.  - Urinalysis, Routine w reflex microscopic   No follow-ups on file.  [name], MD  [facility] [place]

## 2024-06-02 NOTE — Progress Notes (Signed)
 06/02/2024 10:53 AM   Aaron Chambers Sep 09, 1980 [id number]  Referring provider: Donnajean Lynwood DEL, PA-C [address] [place],  [place] [address]  Nephrolithiasis   HPI: Aaron Chambers is a 43yo here for evaluation of right ureteral calculus. Starting 4 days ago he developed constant, sharp, moderate rigth flank pain. His first stone event was 2009. He passes 1-2 stones per year. He presented to the ER 11/22 and was diagnosed with a 1.2cm right mid ureteral calculus. KUB from today shows right 1.2cm over the pelvis. He has nausea   PMH: Past Medical History:  Diagnosis Date   Allergic rhinitis    Anxiety    Arrhythmia    Asthma    Diabetes mellitus without complication (HCC)    History of kidney stones    Obesity     Surgical History: Past Surgical History:  Procedure Laterality Date   CARDIAC ELECTROPHYSIOLOGY STUDY AND ABLATION     VASCULAR SURGERY     varicose veins    Home Medications:  Allergies as of 06/02/2024   No Known Allergies      Medication List        Accurate as of June 02, 2024 10:53 AM. If you have any questions, ask your nurse or doctor.          blood glucose meter kit and supplies Dispense based on patient and insurance preference. Use to check glucose once daily (FOR ICD-10  E11.9).   buPROPion  100 MG tablet Commonly known as: WELLBUTRIN  Take 200 mg by mouth every morning.   clonazePAM  1 MG tablet Commonly known as: KLONOPIN  Take 1 mg by mouth 2 (two) times daily as needed for anxiety.   glucose blood test strip Test glucose 1x per day.   ibuprofen 200 MG tablet Commonly known as: ADVIL Take 400 mg by mouth daily as needed for headache.   lamoTRIgine  100 MG tablet Commonly known as: LAMICTAL  Take 100 mg by mouth daily.   metFORMIN  500 MG tablet Commonly known as: GLUCOPHAGE  TAKE 2 TABLETS BY MOUTH TWICE DAILY.   Mounjaro 15 MG/0.5ML Pen Generic drug: tirzepatide Inject 15 mg into the skin once a week.    multivitamin with minerals Tabs tablet Take 1 tablet by mouth daily.   oxyCODONE  5 MG immediate release tablet Commonly known as: Roxicodone  Take 1 tablet (5 mg total) by mouth every 4 (four) hours as needed.   Rezdiffra  100 MG Tabs Generic drug: Resmetirom  Take 100 mg by mouth daily at 6 (six) AM.   rosuvastatin 10 MG tablet Commonly known as: CRESTOR Take 10 mg by mouth at bedtime.   venlafaxine XR 37.5 MG 24 hr capsule Commonly known as: EFFEXOR-XR Take 37.5 mg by mouth daily.   VITAMIN D PO Take 1 capsule by mouth daily.        Allergies: No Known Allergies  Family History: No family history on file.  Social History:  reports that he has never smoked. His smokeless tobacco use includes snuff. He reports that he does not drink alcohol and does not use drugs.  ROS: All other review of systems were reviewed and are negative except what is noted above in HPI  Physical Exam: BP 127/85   Pulse (!) 108   Constitutional:  Alert and oriented, No acute distress. HEENT: [place] AT, moist mucus membranes.  Trachea midline, no masses. Cardiovascular: No clubbing, cyanosis, or edema. Respiratory: Normal respiratory effort, no increased work of breathing. GI: Abdomen is soft, nontender, nondistended, no abdominal masses  GU: No CVA tenderness.  Lymph: No cervical or inguinal lymphadenopathy. Skin: No rashes, bruises or suspicious lesions. Neurologic: Grossly intact, no focal deficits, moving all 4 extremities. Psychiatric: Normal mood and affect.  Laboratory Data: Lab Results  Component Value Date   WBC 11.3 (H) 05/31/2024   HGB 13.6 05/31/2024   HCT 41.6 05/31/2024   MCV 83.4 05/31/2024   PLT 182 05/31/2024    Lab Results  Component Value Date   CREATININE 1.09 05/31/2024    No results found for: PSA  No results found for: TESTOSTERONE  Lab Results  Component Value Date   HGBA1C 6.1 (H) 11/16/2023    Urinalysis    Component Value Date/Time    COLORURINE YELLOW 05/31/2024 1236   APPEARANCEUR CLEAR 05/31/2024 1236   LABSPEC 1.018 05/31/2024 1236   PHURINE 5.0 05/31/2024 1236   GLUCOSEU NEGATIVE 05/31/2024 1236   HGBUR SMALL (A) 05/31/2024 1236   BILIRUBINUR NEGATIVE 05/31/2024 1236   KETONESUR NEGATIVE 05/31/2024 1236   PROTEINUR NEGATIVE 05/31/2024 1236   NITRITE NEGATIVE 05/31/2024 1236   LEUKOCYTESUR NEGATIVE 05/31/2024 1236    Lab Results  Component Value Date   BACTERIA NONE SEEN 05/31/2024    Pertinent Imaging: Ct 05/31/2024 and KUB today: Images reviewed and discussed with the patient. No results found for this or any previous visit.  No results found for this or any previous visit.  No results found for this or any previous visit.  No results found for this or any previous visit.  No results found for this or any previous visit.  No results found for this or any previous visit.  No results found for this or any previous visit.  Results for orders placed during the hospital encounter of 05/31/24  CT Renal Stone Study  Narrative CLINICAL DATA:  Abdominal/flank pain.  Concern for kidney stone.  EXAM: CT ABDOMEN AND PELVIS WITHOUT CONTRAST  TECHNIQUE: Multidetector CT imaging of the abdomen and pelvis was performed following the standard protocol without IV contrast.  RADIATION DOSE REDUCTION: This exam was performed according to the departmental dose-optimization program which includes automated exposure control, adjustment of the mA and/or kV according to patient size and/or use of iterative reconstruction technique.  COMPARISON:  CT abdomen pelvis dated 11/23/2019.  FINDINGS: Evaluation of this exam is limited in the absence of intravenous contrast.  Lower chest: The visualized lung bases are clear.  No intra-abdominal free air or free fluid.  Hepatobiliary: The liver is unremarkable. No BD dilatation. The gallbladder unremarkable.  Pancreas: Unremarkable. No pancreatic ductal  dilatation or surrounding inflammatory changes.  Spleen: Normal in size without focal abnormality.  Adrenals/Urinary Tract: The adrenal glands are unremarkable. Multiple nonobstructing bilateral renal calculi measure up to 8 mm in the upper pole of the left kidney. There is a 10 x 12 mm stone in the distal right ureter with mild right hydronephrosis. No hydronephrosis or obstructing stone on the left. The left ureter and urinary bladder appear unremarkable.  Stomach/Bowel: There is no bowel obstruction or active inflammation. The appendix is normal.  Vascular/Lymphatic: The abdominal aorta and IVC are grossly unremarkable on this noncontrast CT. No portal venous gas. There is no adenopathy.  Reproductive: The prostate and seminal vesicles are grossly unremarkable.  Other: None  Musculoskeletal: Osteopenia with degenerative changes of spine. No acute osseous pathology.  IMPRESSION: 1. A 10 x 12 mm stone in the distal right ureter with mild right hydronephrosis. 2. Multiple nonobstructing bilateral renal calculi. 3. No bowel obstruction. Normal appendix.  Electronically Signed By: Vanetta Chou M.D. On: 05/31/2024 12:58   Assessment & Plan:    1. Kidney stone (Primary) -We discussed the management of kidney stones. These options include observation, ureteroscopy, shockwave lithotripsy (ESWL) and percutaneous nephrolithotomy (PCNL). We discussed which options are relevant to the patient's stone(s). We discussed the natural history of kidney stones as well as the complications of untreated stones and the impact on quality of life without treatment as well as with each of the above listed treatments. We also discussed the efficacy of each treatment in its ability to clear the stone burden. With any of these management options I discussed the signs and symptoms of infection and the need for emergent treatment should these be experienced. For each option we discussed the  ability of each procedure to clear the patient of their stone burden.   For observation I described the risks which include but are not limited to silent renal damage, life-threatening infection, need for emergent surgery, failure to pass stone and pain.   For ureteroscopy I described the risks which include bleeding, infection, damage to contiguous structures, positioning injury, ureteral stricture, ureteral avulsion, ureteral injury, need for prolonged ureteral stent, inability to perform ureteroscopy, need for an interval procedure, inability to clear stone burden, stent discomfort/pain, heart attack, stroke, pulmonary embolus and the inherent risks with general anesthesia.   For shockwave lithotripsy I described the risks which include arrhythmia, kidney contusion, kidney hemorrhage, need for transfusion, pain, inability to adequately break up stone, inability to pass stone fragments, Steinstrasse, infection associated with obstructing stones, need for alternate surgical procedure, need for repeat shockwave lithotripsy, MI, CVA, PE and the inherent risks with anesthesia/conscious sedation.   For PCNL I described the risks including positioning injury, pneumothorax, hydrothorax, need for chest tube, inability to clear stone burden, renal laceration, arterial venous fistula or malformation, need for embolization of kidney, loss of kidney or renal function, need for repeat procedure, need for prolonged nephrostomy tube, ureteral avulsion, MI, CVA, PE and the inherent risks of general anesthesia.   - The patient would like to proceed with right ESWL.  - Urinalysis, Routine w reflex microscopic   No follow-ups on file.  [name], MD  [facility] [place]

## 2024-06-03 ENCOUNTER — Encounter (HOSPITAL_COMMUNITY): Admission: RE | Disposition: A | Payer: Self-pay | Source: Home / Self Care | Attending: Urology

## 2024-06-03 ENCOUNTER — Ambulatory Visit (HOSPITAL_COMMUNITY)

## 2024-06-03 ENCOUNTER — Ambulatory Visit (HOSPITAL_COMMUNITY)
Admission: RE | Admit: 2024-06-03 | Discharge: 2024-06-03 | Disposition: A | Discharge status: Home / Self Care | Attending: Urology | Admitting: Urology

## 2024-06-03 ENCOUNTER — Encounter (HOSPITAL_COMMUNITY): Payer: Self-pay | Admitting: Urology

## 2024-06-03 DIAGNOSIS — Z87442 Personal history of urinary calculi: Secondary | ICD-10-CM | POA: Insufficient documentation

## 2024-06-03 DIAGNOSIS — Z7984 Long term (current) use of oral hypoglycemic drugs: Secondary | ICD-10-CM | POA: Insufficient documentation

## 2024-06-03 DIAGNOSIS — Z7985 Long-term (current) use of injectable non-insulin antidiabetic drugs: Secondary | ICD-10-CM | POA: Insufficient documentation

## 2024-06-03 DIAGNOSIS — G4730 Sleep apnea, unspecified: Secondary | ICD-10-CM | POA: Insufficient documentation

## 2024-06-03 DIAGNOSIS — E119 Type 2 diabetes mellitus without complications: Secondary | ICD-10-CM | POA: Insufficient documentation

## 2024-06-03 DIAGNOSIS — N200 Calculus of kidney: Secondary | ICD-10-CM

## 2024-06-03 DIAGNOSIS — N132 Hydronephrosis with renal and ureteral calculous obstruction: Secondary | ICD-10-CM | POA: Insufficient documentation

## 2024-06-03 DIAGNOSIS — E669 Obesity, unspecified: Secondary | ICD-10-CM | POA: Insufficient documentation

## 2024-06-03 HISTORY — PX: EXTRACORPOREAL SHOCK WAVE LITHOTRIPSY: SHX1557

## 2024-06-03 HISTORY — DX: Sleep apnea, unspecified: G47.30

## 2024-06-03 LAB — GLUCOSE, CAPILLARY: Glucose-Capillary: 118 mg/dL — ABNORMAL HIGH (ref 70–99)

## 2024-06-03 SURGERY — LITHOTRIPSY, ESWL
Anesthesia: LOCAL | Laterality: Right

## 2024-06-03 MED ORDER — DIAZEPAM 5 MG PO TABS
10.0000 mg | ORAL_TABLET | Freq: Once | ORAL | Status: AC
  Administered 2024-06-03: 10 mg via ORAL
  Filled 2024-06-03: qty 2

## 2024-06-03 MED ORDER — TAMSULOSIN HCL 0.4 MG PO CAPS
0.4000 mg | ORAL_CAPSULE | Freq: Every day | ORAL | 1 refills | Status: AC
Start: 2024-06-03 — End: ?

## 2024-06-03 MED ORDER — SODIUM CHLORIDE 0.9 % IV SOLN: INTRAVENOUS | Status: DC

## 2024-06-03 MED ORDER — OXYCODONE HCL 5 MG PO TABS: 5.0000 mg | ORAL_TABLET | ORAL | 0 refills | Status: AC | PRN

## 2024-06-03 MED ORDER — ONDANSETRON HCL 4 MG PO TABS: 4.0000 mg | ORAL_TABLET | Freq: Every day | ORAL | 1 refills | Status: AC | PRN

## 2024-06-03 MED ORDER — DIPHENHYDRAMINE HCL 25 MG PO CAPS
25.0000 mg | ORAL_CAPSULE | ORAL | Status: AC
  Administered 2024-06-03: 25 mg via ORAL
  Filled 2024-06-03: qty 1

## 2024-06-03 NOTE — Progress Notes (Signed)
 Assessed pt right posterior side where treatment performed. Redness noted, no complaints.

## 2024-06-03 NOTE — Interval H&P Note (Signed)
 History and Physical Interval Note:  06/03/2024 9:17 AM  Aaron Chambers  has presented today for surgery, with the diagnosis of Right Ureteral Stone.  The various methods of treatment have been discussed with the patient and family. After consideration of risks, benefits and other options for treatment, the patient has consented to  Procedure(s): LITHOTRIPSY, ESWL (Right) as a surgical intervention.  The patient's history has been reviewed, patient examined, no change in status, stable for surgery.  I have reviewed the patient's chart and labs.  Questions were answered to the patient's satisfaction.     [name]

## 2024-06-04 ENCOUNTER — Encounter (HOSPITAL_COMMUNITY): Payer: Self-pay | Admitting: Urology

## 2024-06-16 ENCOUNTER — Ambulatory Visit (HOSPITAL_COMMUNITY)
Admission: RE | Admit: 2024-06-16 | Discharge: 2024-06-16 | Disposition: A | Source: Ambulatory Visit | Attending: Urology

## 2024-06-16 DIAGNOSIS — N200 Calculus of kidney: Secondary | ICD-10-CM

## 2024-07-16 ENCOUNTER — Other Ambulatory Visit: Payer: Self-pay

## 2024-07-16 ENCOUNTER — Ambulatory Visit: Payer: Self-pay

## 2024-07-16 DIAGNOSIS — N200 Calculus of kidney: Secondary | ICD-10-CM

## 2024-07-17 ENCOUNTER — Telehealth: Payer: Self-pay | Admitting: Urology

## 2024-07-17 NOTE — Telephone Encounter (Signed)
 It has already been scheduled.  US  needed prior, I sent a mychart message yesterday.

## 2024-07-17 NOTE — Telephone Encounter (Signed)
 Wife called to schedule stone follow up, he did not have appt after procedure. Call [phone]

## 2024-08-26 ENCOUNTER — Ambulatory Visit: Admitting: Internal Medicine

## 2025-01-21 ENCOUNTER — Ambulatory Visit: Admitting: Urology
